# Patient Record
Sex: Female | Born: 1955 | Race: Black or African American | Hispanic: No | Marital: Single | Smoking: Current every day smoker
Health system: Southern US, Community
[De-identification: ages and names within clinical notes are randomized; demographics above are authoritative.]

## PROBLEM LIST (undated history)

## (undated) DIAGNOSIS — I428 Other cardiomyopathies: Secondary | ICD-10-CM

## (undated) DIAGNOSIS — Z720 Tobacco use: Secondary | ICD-10-CM

## (undated) DIAGNOSIS — I493 Ventricular premature depolarization: Secondary | ICD-10-CM

## (undated) DIAGNOSIS — J449 Chronic obstructive pulmonary disease, unspecified: Secondary | ICD-10-CM

## (undated) DIAGNOSIS — M545 Low back pain, unspecified: Secondary | ICD-10-CM

## (undated) DIAGNOSIS — M1990 Unspecified osteoarthritis, unspecified site: Secondary | ICD-10-CM

## (undated) DIAGNOSIS — I5022 Chronic systolic (congestive) heart failure: Secondary | ICD-10-CM

## (undated) DIAGNOSIS — N189 Chronic kidney disease, unspecified: Secondary | ICD-10-CM

## (undated) DIAGNOSIS — N96 Recurrent pregnancy loss: Secondary | ICD-10-CM

## (undated) DIAGNOSIS — G8929 Other chronic pain: Secondary | ICD-10-CM

## (undated) DIAGNOSIS — I10 Essential (primary) hypertension: Secondary | ICD-10-CM

## (undated) DIAGNOSIS — Z95810 Presence of automatic (implantable) cardiac defibrillator: Secondary | ICD-10-CM

## (undated) HISTORY — DX: Chronic obstructive pulmonary disease, unspecified: J44.9

## (undated) HISTORY — DX: Ventricular premature depolarization: I49.3

## (undated) HISTORY — DX: Essential (primary) hypertension: I10

## (undated) HISTORY — DX: Tobacco use: Z72.0

## (undated) HISTORY — DX: Chronic systolic (congestive) heart failure: I50.22

## (undated) HISTORY — PX: LACERATION REPAIR: SHX5168

## (undated) HISTORY — DX: Morbid (severe) obesity due to excess calories: E66.01

## (undated) HISTORY — PX: TOOTH EXTRACTION: SUR596

## (undated) HISTORY — DX: Recurrent pregnancy loss: N96

## (undated) HISTORY — DX: Other cardiomyopathies: I42.8

## (undated) HISTORY — PX: GANGLION CYST EXCISION: SHX1691

---

## 2004-10-05 ENCOUNTER — Emergency Department: Payer: Self-pay | Admitting: Emergency Medicine

## 2005-12-26 ENCOUNTER — Ambulatory Visit: Payer: Self-pay | Admitting: *Deleted

## 2011-11-17 ENCOUNTER — Ambulatory Visit: Payer: Self-pay

## 2011-11-17 ENCOUNTER — Inpatient Hospital Stay: Payer: Self-pay | Admitting: Internal Medicine

## 2011-11-17 DIAGNOSIS — R0602 Shortness of breath: Secondary | ICD-10-CM

## 2011-11-17 DIAGNOSIS — J9600 Acute respiratory failure, unspecified whether with hypoxia or hypercapnia: Secondary | ICD-10-CM

## 2011-11-17 LAB — CK TOTAL AND CKMB (NOT AT ARMC)
CK, Total: 131 U/L (ref 21–215)
CK, Total: 143 U/L (ref 21–215)
CK-MB: 1.6 ng/mL (ref 0.5–3.6)

## 2011-11-17 LAB — CBC
MCH: 29.4 pg (ref 26.0–34.0)
MCV: 89 fL (ref 80–100)
Platelet: 265 10*3/uL (ref 150–440)
RDW: 15.2 % — ABNORMAL HIGH (ref 11.5–14.5)
WBC: 5 10*3/uL (ref 3.6–11.0)

## 2011-11-17 LAB — COMPREHENSIVE METABOLIC PANEL
Albumin: 3.6 g/dL (ref 3.4–5.0)
Anion Gap: 8 (ref 7–16)
Calcium, Total: 9.2 mg/dL (ref 8.5–10.1)
Osmolality: 283 (ref 275–301)
Potassium: 4.1 mmol/L (ref 3.5–5.1)
SGOT(AST): 45 U/L — ABNORMAL HIGH (ref 15–37)
SGPT (ALT): 55 U/L (ref 12–78)
Total Protein: 7.2 g/dL (ref 6.4–8.2)

## 2011-11-17 LAB — PRO B NATRIURETIC PEPTIDE: B-Type Natriuretic Peptide: 2989 pg/mL — ABNORMAL HIGH (ref 0–125)

## 2011-11-18 LAB — LIPID PANEL
Cholesterol: 172 mg/dL (ref 0–200)
HDL Cholesterol: 74 mg/dL — ABNORMAL HIGH (ref 40–60)

## 2011-11-18 LAB — TROPONIN I: Troponin-I: 0.12 ng/mL — ABNORMAL HIGH

## 2011-11-19 ENCOUNTER — Encounter: Payer: Self-pay | Admitting: Cardiovascular Disease

## 2011-11-19 DIAGNOSIS — I428 Other cardiomyopathies: Secondary | ICD-10-CM

## 2011-11-19 HISTORY — PX: CARDIAC CATHETERIZATION: SHX172

## 2011-11-20 ENCOUNTER — Telehealth: Payer: Self-pay

## 2011-11-20 NOTE — Telephone Encounter (Signed)
TCM call

## 2011-11-20 NOTE — Telephone Encounter (Signed)
Message copied by Marcelle Overlie on Wed Nov 20, 2011  9:29 AM ------      Message from: [place] E      Created: Wed Nov 20, 2011  7:47 AM      Regarding: tcm patient       tcm patient

## 2011-11-21 NOTE — Telephone Encounter (Signed)
Pt called back She is feeling well post discharge from [facility] for dx CHF She denies sob, edema or any other symptoms of CHF She confirms compliance with all meds as she was given at d/c  She has all RX She has bought a scale for her home and has been weighing daily Her weight is only up 4 ounces compared to yesterday's weight She confirms compliance with low sodium diet Confirmed appt with Dr. Mariah Milling 11/5 at 0900

## 2011-11-21 NOTE — Telephone Encounter (Signed)
TCM attempt #1 LMTCB

## 2011-12-02 DIAGNOSIS — J449 Chronic obstructive pulmonary disease, unspecified: Secondary | ICD-10-CM | POA: Insufficient documentation

## 2011-12-03 ENCOUNTER — Ambulatory Visit (INDEPENDENT_AMBULATORY_CARE_PROVIDER_SITE_OTHER): Payer: BC Managed Care – PPO | Admitting: Cardiovascular Disease

## 2011-12-03 ENCOUNTER — Encounter: Payer: Self-pay | Admitting: Cardiovascular Disease

## 2011-12-03 VITALS — BP 112/80 | HR 71 | Ht 64.5 in | Wt 236.2 lb

## 2011-12-03 DIAGNOSIS — I428 Other cardiomyopathies: Secondary | ICD-10-CM | POA: Insufficient documentation

## 2011-12-03 DIAGNOSIS — R0602 Shortness of breath: Secondary | ICD-10-CM

## 2011-12-03 MED ORDER — CARVEDILOL 6.25 MG PO TABS: 6.2500 mg | ORAL_TABLET | Freq: Two times a day (BID) | ORAL | Status: DC

## 2011-12-03 NOTE — Progress Notes (Signed)
Patient ID: Charlene Schmitt, female    DOB: 13-Jul-1955, 56 y.o.   MRN: [id number]  HPI Comments: Charlene Schmitt is a pleasant 56 year old woman with history of obesity, hypertension who presented to the hospital with shortness of breath for the past 10 days. Workup documented nonischemic cardiomyopathy, ejection fraction less than 25%, cardiac catheterization showing no significant coronary artery disease, normal right ventricular systolic pressures on echocardiogram. She presents to establish care in the office.  She reports that since her discharge, her weight has been relatively stable at 235 pounds. She did have occasional episodes of chest fullness and she took extra diuretic with improvement of her symptoms. She denies any significant lower extremity edema. Blood pressure has been relatively stable. She is sleeping well, able to lie flat. She has recently returned to work but is not doing any heavy lifting.  EKG shows normal sinus rhythm with rate 71 beats per minute with nonspecific T wave abnormality in V4 through V6, 1 and aVL   Outpatient Encounter Prescriptions as of 12/03/2011  Medication Sig Dispense Refill  . albuterol (PROVENTIL HFA;VENTOLIN HFA) 108 (90 BASE) MCG/ACT inhaler Inhale 2 puffs into the lungs every 6 (six) hours as needed.      Marland Kitchen aspirin 81 MG tablet Take 81 mg by mouth daily.      . carvedilol (COREG) 6.25 MG tablet Take 1 tablet (6.25 mg total) by mouth 2 (two) times daily with a meal.  180 tablet  3  . fexofenadine (ALLEGRA) 180 MG tablet Take 180 mg by mouth daily as needed.      . Fluticasone-Salmeterol (ADVAIR DISKUS) 250-50 MCG/DOSE AEPB Inhale 1 puff into the lungs every 12 (twelve) hours.      . furosemide (LASIX) 20 MG tablet Take 20 mg by mouth 2 (two) times daily.      Marland Kitchen ibuprofen (ADVIL,MOTRIN) 200 MG tablet Take 200 mg by mouth every 6 (six) hours as needed.      Marland Kitchen lisinopril (PRINIVIL,ZESTRIL) 20 MG tablet Take 20 mg by mouth daily.      . potassium  chloride SA (K-DUR,KLOR-CON) 20 MEQ tablet Take 20 mEq by mouth 2 (two) times daily.       Review of Systems  Constitutional: Negative.   HENT: Negative.   Eyes: Negative.   Respiratory: Negative.   Cardiovascular: Negative.   Gastrointestinal: Negative.   Musculoskeletal: Negative.   Skin: Negative.   Neurological: Negative.   Hematological: Negative.   Psychiatric/Behavioral: Negative.   All other systems reviewed and are negative.    BP 112/80  Pulse 71  Ht 5' 4.5" (1.638 m)  Wt 236 lb 4 oz (107.162 kg)  BMI 39.93 kg/m2  Physical Exam  Nursing note and vitals reviewed. Constitutional: She is oriented to person, place, and time. She appears well-developed and well-nourished.  HENT:  Head: Normocephalic.  Nose: Nose normal.  Mouth/Throat: Oropharynx is clear and moist.  Eyes: Conjunctivae normal are normal. Pupils are equal, round, and reactive to light.  Neck: Normal range of motion. Neck supple. No JVD present.  Cardiovascular: Normal rate, regular rhythm, S1 normal, S2 normal, normal heart sounds and intact distal pulses.  Exam reveals no gallop and no friction rub.   No murmur heard. Pulmonary/Chest: Effort normal and breath sounds normal. No respiratory distress. She has no wheezes. She has no rales. She exhibits no tenderness.  Abdominal: Soft. Bowel sounds are normal. She exhibits no distension. There is no tenderness.  Musculoskeletal: Normal range of motion. She exhibits no  edema and no tenderness.  Lymphadenopathy:    She has no cervical adenopathy.  Neurological: She is alert and oriented to person, place, and time. Coordination normal.  Skin: Skin is warm and dry. No rash noted. No erythema.  Psychiatric: She has a normal mood and affect. Her behavior is normal. Judgment and thought content normal.         Assessment and Plan

## 2011-12-03 NOTE — Patient Instructions (Addendum)
You are doing well. Please increase the coreg to 6.25 mg twice a day  Continue to monitor your weight  Please call us if you have new issues that need to be addressed before your next appt.  Your physician wants you to follow-up in: 3 months.  You will receive a reminder letter in the mail two months in advance. If you don't receive a letter, please call our office to schedule the follow-up appointment.

## 2011-12-03 NOTE — Assessment & Plan Note (Signed)
Improvement of her shortness of breath. We have encouraged her to start a exercise program.

## 2011-12-03 NOTE — Assessment & Plan Note (Signed)
Cardiac catheterization showing no coronary artery disease. We'll continue aggressive medical management. We will increase the coronary to 6.25 mg twice a day, continue lisinopril, continue diuretic with close monitoring of her weight.

## 2011-12-09 ENCOUNTER — Encounter: Payer: Self-pay | Admitting: Cardiovascular Disease

## 2011-12-12 ENCOUNTER — Encounter: Payer: Self-pay | Admitting: Cardiovascular Disease

## 2012-02-05 ENCOUNTER — Telehealth: Payer: Self-pay | Admitting: Cardiovascular Disease

## 2012-02-05 NOTE — Telephone Encounter (Signed)
fyi

## 2012-02-05 NOTE — Telephone Encounter (Signed)
Could be ACE cough See how it goes

## 2012-02-05 NOTE — Telephone Encounter (Signed)
Pt says that she has had a cough for about 2 weeks. Pt started new meds in oct. Pt carvedilol was increased in December.

## 2012-02-05 NOTE — Telephone Encounter (Signed)
LMTCB

## 2012-02-05 NOTE — Telephone Encounter (Signed)
Pt says she has had a chronic nagging cough for several weeks now Denies worsening sob, edema, weight change Afebrile Is awakened at night with cough Productive of white secretions Confirms compliance with meds i advised her to hold lisinopril for a few days to see if this helps cough I will call her back Friday to reassess Understanding verb.

## 2012-02-07 ENCOUNTER — Other Ambulatory Visit: Payer: Self-pay

## 2012-02-07 ENCOUNTER — Telehealth: Payer: Self-pay

## 2012-02-07 MED ORDER — LOSARTAN POTASSIUM 50 MG PO TABS: 50.0000 mg | ORAL_TABLET | Freq: Every day | ORAL | Status: DC

## 2012-02-07 NOTE — Telephone Encounter (Signed)
Call pt to assess cough since holding lisinopril

## 2012-02-07 NOTE — Telephone Encounter (Signed)
"  Have pt monitor blood pressures. Stop lisinopril. Start losartan 50 mg daily. If blood pressure does not tolerate, may decrease to 25 mg daily" VO Dr. Alvis Lemmings, RN  Pt informed Understanding verb New RX sent to pharmacy

## 2012-02-07 NOTE — Telephone Encounter (Signed)
Pt called back Says her cough has improved "quite a bit" since holding lisinopril I told her I will let Dr. Mariah Milling know and call her back at [phone].

## 2012-02-07 NOTE — Telephone Encounter (Signed)
Attempted to call pt at home #; message says "patient cannot accept calls at this time" I then tried to reach pt at work #; I was told she is not in today I then tried home # again and got same message as before Will try again later

## 2012-02-07 NOTE — Telephone Encounter (Signed)
No answer

## 2012-02-11 ENCOUNTER — Telehealth: Payer: Self-pay

## 2012-02-11 NOTE — Telephone Encounter (Signed)
Sometimes fluid can present with a cough She has low ejection fraction and prone to heart failure That she should try several days of extra diuretic Hold the losartan until cough resolves and then consider trying the losartan 1 more time Need to make sure that the cough from lisinopril or losartan is not from heart failure

## 2012-02-11 NOTE — Telephone Encounter (Signed)
Pt started losartan as directed She says her cough has returned despite stopping lisinopril Cough is productive; coughing up white sputum Afebrile BP=127/80 I told her to continue meds as prescribed until I talk with Dr. Mariah Milling and then I will call her back Understanding verb

## 2012-02-12 NOTE — Telephone Encounter (Signed)
Pt called back. Instructions given re: extra diuretic I instructed her to resume carvedilol since she has low EF Understanding verb She will see how cough improves with extra lasix We will check back with her Monday 1/20

## 2012-02-12 NOTE — Telephone Encounter (Signed)
Attempted to reach pt at work but was told she is out of work rest of week She gave me pt's cell # (listed as home #) to call I called cell # provided/home # and had to [facility]

## 2012-02-17 ENCOUNTER — Telehealth: Payer: Self-pay

## 2012-02-17 NOTE — Telephone Encounter (Signed)
What is her weight running? She was previously 235. We may need basic metabolic panel and BNP. Follow up in clinic

## 2012-02-17 NOTE — Telephone Encounter (Signed)
Assess cough

## 2012-02-17 NOTE — Telephone Encounter (Signed)
Pt says she is taking an extra lasix daily since we instructed her to do so. Says cough has improved "a little" but she is still coughing up "white stuff". I told her I would let Dr. Mariah Milling know and call her back. Understanding verb.

## 2012-02-18 ENCOUNTER — Other Ambulatory Visit: Payer: Self-pay

## 2012-02-18 ENCOUNTER — Ambulatory Visit (INDEPENDENT_AMBULATORY_CARE_PROVIDER_SITE_OTHER): Payer: BC Managed Care – PPO

## 2012-02-18 DIAGNOSIS — I509 Heart failure, unspecified: Secondary | ICD-10-CM

## 2012-02-18 NOTE — Telephone Encounter (Signed)
Pt says weight is up about 2 pounds (237 pounds). Still has cough I advised, per Dr. Mariah Milling, to come in today for labs and f/u with him tomm in clinic Understanding verb

## 2012-02-19 ENCOUNTER — Encounter: Payer: Self-pay | Admitting: Cardiovascular Disease

## 2012-02-19 ENCOUNTER — Ambulatory Visit (INDEPENDENT_AMBULATORY_CARE_PROVIDER_SITE_OTHER): Payer: BC Managed Care – PPO | Admitting: Cardiovascular Disease

## 2012-02-19 VITALS — BP 148/98 | HR 66 | Ht 65.5 in | Wt 237.5 lb

## 2012-02-19 DIAGNOSIS — F17200 Nicotine dependence, unspecified, uncomplicated: Secondary | ICD-10-CM

## 2012-02-19 DIAGNOSIS — R0602 Shortness of breath: Secondary | ICD-10-CM

## 2012-02-19 DIAGNOSIS — I509 Heart failure, unspecified: Secondary | ICD-10-CM

## 2012-02-19 DIAGNOSIS — I428 Other cardiomyopathies: Secondary | ICD-10-CM

## 2012-02-19 LAB — BASIC METABOLIC PANEL
Calcium: 9.3 mg/dL (ref 8.7–10.2)
Chloride: 107 mmol/L (ref 97–108)
GFR calc non Af Amer: 73 mL/min/{1.73_m2} (ref >59)
Glucose: 104 mg/dL — ABNORMAL HIGH (ref 65–99)
Potassium: 4 mmol/L (ref 3.5–5.2)
Sodium: 144 mmol/L (ref 134–144)

## 2012-02-19 MED ORDER — TORSEMIDE 20 MG PO TABS: 40.0000 mg | ORAL_TABLET | Freq: Two times a day (BID) | ORAL | Status: DC

## 2012-02-19 MED ORDER — ISOSORBIDE MONONITRATE ER 30 MG PO TB24: 30.0000 mg | ORAL_TABLET | Freq: Every day | ORAL | Status: DC

## 2012-02-19 NOTE — Assessment & Plan Note (Signed)
Advance diuretic regimen, isosorbide for blood pressure, recommend she stop smoking

## 2012-02-19 NOTE — Progress Notes (Signed)
Patient ID: Charlene Schmitt, female    DOB: [birth year], 57 y.o.   MRN: [id number]  HPI Comments: Charlene Schmitt is a pleasant 57 year old woman with history of obesity, hypertension who presented to the hospital with shortness of breath,  Workup documented nonischemic cardiomyopathy, ejection fraction less than 25%, cardiac catheterization showing no significant coronary artery disease, normal right ventricular systolic pressures on echocardiogram. She presents for routine follow up.    her weight has been relatively stable, she is up 2 pounds from previous weight of  235 pounds. She does report having a significant cough. She has been slowly increasing her diuretic now taking 20 mg 3 times a day with no improvement of her cough. Basic metabolic panel done yesterday shows normal renal function, normal potassium. We stopped the ACE inhibitor with no improvement of her cough. Currently she takes losartan. She does report blood pressure has been running high recently. She denies any significant edema or abdominal fullness. Some fullness in her chest.  EKG shows normal sinus rhythm with rate 66  beats per minute with nonspecific T wave abnormality in V4 through V6, 1 and aVL   Outpatient Encounter Prescriptions as of 02/19/2012  Medication Sig Dispense Refill  . albuterol (PROVENTIL HFA;VENTOLIN HFA) 108 (90 BASE) MCG/ACT inhaler Inhale 2 puffs into the lungs every 6 (six) hours as needed.      Marland Kitchen aspirin 81 MG tablet Take 81 mg by mouth daily.      . carvedilol (COREG) 6.25 MG tablet Take 1 tablet (6.25 mg total) by mouth 2 (two) times daily with a meal.  180 tablet  3  . fexofenadine (ALLEGRA) 180 MG tablet Take 180 mg by mouth daily as needed.      . Fluticasone-Salmeterol (ADVAIR DISKUS) 250-50 MCG/DOSE AEPB Inhale 1 puff into the lungs every 12 (twelve) hours.      Marland Kitchen ibuprofen (ADVIL,MOTRIN) 200 MG tablet Take 200 mg by mouth every 6 (six) hours as needed.      Marland Kitchen losartan (COZAAR) 50 MG tablet  Take 1 tablet (50 mg total) by mouth daily.  90 tablet  3  . [DISCONTINUED] furosemide (LASIX) 20 MG tablet Take 20 mg by mouth 3 (three) times daily.       . isosorbide mononitrate (IMDUR) 30 MG 24 hr tablet Take 1 tablet (30 mg total) by mouth daily.  30 tablet  6  . torsemide (DEMADEX) 20 MG tablet Take 2 tablets (40 mg total) by mouth 2 (two) times daily.  120 tablet  6  . [DISCONTINUED] potassium chloride SA (K-DUR,KLOR-CON) 20 MEQ tablet Take 20 mEq by mouth 2 (two) times daily.        Review of Systems  Constitutional: Negative.   HENT: Negative.   Eyes: Negative.   Respiratory: Positive for cough and shortness of breath.        Chest fullness  Cardiovascular: Negative.   Gastrointestinal: Negative.   Musculoskeletal: Negative.   Skin: Negative.   Neurological: Negative.   Hematological: Negative.   Psychiatric/Behavioral: Negative.   All other systems reviewed and are negative.    BP 148/98  Pulse 66  Ht 5' 5.5" (1.664 m)  Wt 237 lb 8 oz (107.729 kg)  BMI 38.92 kg/m2  Physical Exam  Nursing note and vitals reviewed. Constitutional: She is oriented to person, place, and time. She appears well-developed and well-nourished.       obese  HENT:  Head: Normocephalic.  Nose: Nose normal.  Mouth/Throat: Oropharynx is clear and moist.  Eyes: Conjunctivae normal are normal. Pupils are equal, round, and reactive to light.  Neck: Normal range of motion. Neck supple. No JVD present.  Cardiovascular: Normal rate, regular rhythm, S1 normal, S2 normal, normal heart sounds and intact distal pulses.  Exam reveals no gallop and no friction rub.   No murmur heard. Pulmonary/Chest: Effort normal and breath sounds normal. No respiratory distress. She has no wheezes. She has no rales. She exhibits no tenderness.  Abdominal: Soft. Bowel sounds are normal. She exhibits no distension. There is no tenderness.  Musculoskeletal: Normal range of motion. She exhibits no edema and no tenderness.    Lymphadenopathy:    She has no cervical adenopathy.  Neurological: She is alert and oriented to person, place, and time. Coordination normal.  Skin: Skin is warm and dry. No rash noted. No erythema.  Psychiatric: She has a normal mood and affect. Her behavior is normal. Judgment and thought content normal.         Assessment and Plan

## 2012-02-19 NOTE — Assessment & Plan Note (Signed)
Cough over the past few weeks, no improvement holding ACE inhibitor or increasing Lasix. We will change her to torsemide 40 mg twice a day, add isosorbide 30 mg daily. She continues to smoke

## 2012-02-19 NOTE — Patient Instructions (Addendum)
Hold lasix   Start torsemide 40 mg twice a day  Start isosorbide for high blood pressure and for heart  Please call us if you have new issues that need to be addressed before your next appt.  Follow up in one month

## 2012-02-19 NOTE — Assessment & Plan Note (Signed)
We have encouraged her to continue to work on weaning her cigarettes and smoking cessation. She will continue to work on this and does not want any assistance with chantix.  

## 2012-03-04 ENCOUNTER — Ambulatory Visit (INDEPENDENT_AMBULATORY_CARE_PROVIDER_SITE_OTHER): Payer: BC Managed Care – PPO | Admitting: Cardiovascular Disease

## 2012-03-04 ENCOUNTER — Encounter: Payer: Self-pay | Admitting: Cardiovascular Disease

## 2012-03-04 VITALS — BP 100/80 | HR 78 | Ht 65.0 in | Wt 236.8 lb

## 2012-03-04 DIAGNOSIS — R0789 Other chest pain: Secondary | ICD-10-CM

## 2012-03-04 DIAGNOSIS — R0602 Shortness of breath: Secondary | ICD-10-CM

## 2012-03-04 DIAGNOSIS — I428 Other cardiomyopathies: Secondary | ICD-10-CM

## 2012-03-04 DIAGNOSIS — F17200 Nicotine dependence, unspecified, uncomplicated: Secondary | ICD-10-CM

## 2012-03-04 MED ORDER — ISOSORBIDE MONONITRATE ER 30 MG PO TB24: 30.0000 mg | ORAL_TABLET | Freq: Two times a day (BID) | ORAL | Status: DC

## 2012-03-04 NOTE — Progress Notes (Signed)
Patient ID: Charlene Schmitt, female    DOB: [birth year], 57 y.o.   MRN: [id number]  HPI Comments: Charlene Schmitt is a pleasant 57 year old woman with history of obesity, hypertension who presented to the hospital with shortness of breath,  found to have nonischemic cardiomyopathy, ejection fraction less than 25%, cardiac catheterization showing no significant coronary artery disease, normal right ventricular systolic pressures on echocardiogram. She presents for routine follow up.   Underlies clinic visit, she had weight gain and cough. ACE inhibitor was held in the past, changed to losartan with no significant improvement in her symptoms. She was started on torsemide, Lasix was held. She is taking torsemide 20 mg 3 times a day. Also started on isosorbide mononitrate 30 mg daily. She feels well on this combination. Shortness of breath and cough have improved. Better energy. Overall feels much better. She denies lower extremity edema, lies flat in bed to sleep.  Blood pressure typically in the 120-130 range systolic She does not use her inhalers as much now as her breathing is better  EKG shows normal sinus rhythm with rate 78  beats per minute with nonspecific T wave abnormality in V4 through V6, 1 and aVL   Outpatient Encounter Prescriptions as of 03/04/2012  Medication Sig Dispense Refill  . albuterol (PROVENTIL HFA;VENTOLIN HFA) 108 (90 BASE) MCG/ACT inhaler Inhale 2 puffs into the lungs every 6 (six) hours as needed.      Marland Kitchen aspirin 81 MG tablet Take 81 mg by mouth daily.      . carvedilol (COREG) 6.25 MG tablet Take 1 tablet (6.25 mg total) by mouth 2 (two) times daily with a meal.  180 tablet  3  . fexofenadine (ALLEGRA) 180 MG tablet Take 180 mg by mouth daily as needed.      . Fluticasone-Salmeterol (ADVAIR DISKUS) 250-50 MCG/DOSE AEPB Inhale 1 puff into the lungs as needed.       Marland Kitchen ibuprofen (ADVIL,MOTRIN) 200 MG tablet Take 200 mg by mouth every 6 (six) hours as needed.      Marland Kitchen losartan  (COZAAR) 50 MG tablet Take 1 tablet (50 mg total) by mouth daily.  90 tablet  3  . omeprazole (PRILOSEC) 20 MG capsule Take 20 mg by mouth daily.      . potassium chloride SA (K-DUR,KLOR-CON) 20 MEQ tablet Take 20 mEq by mouth 2 (two) times daily.      Marland Kitchen torsemide (DEMADEX) 20 MG tablet Take 2 tablets (40 mg total) by mouth 2 (two) times daily.she takes this 20 mg 3 times a day   120 tablet  6  .  isosorbide mononitrate (IMDUR) 30 MG 24 hr tablet Take 1 tablet (30 mg total) by mouth daily.  30 tablet  6    Review of Systems  Constitutional: Negative.   HENT: Negative.   Eyes: Negative.   Cardiovascular: Negative.   Gastrointestinal: Negative.   Musculoskeletal: Negative.   Skin: Negative.   Neurological: Negative.   Hematological: Negative.   Psychiatric/Behavioral: Negative.   All other systems reviewed and are negative.    BP 100/80  Pulse 78  Ht 5\' 5"  (1.651 m)  Wt 236 lb 12 oz (107.389 kg)  BMI 39.40 kg/m2  Physical Exam  Nursing note and vitals reviewed. Constitutional: She is oriented to person, place, and time. She appears well-developed and well-nourished.       obese  HENT:  Head: Normocephalic.  Nose: Nose normal.  Mouth/Throat: Oropharynx is clear and moist.  Eyes: Conjunctivae normal are  normal. Pupils are equal, round, and reactive to light.  Neck: Normal range of motion. Neck supple. No JVD present.  Cardiovascular: Normal rate, regular rhythm, S1 normal, S2 normal, normal heart sounds and intact distal pulses.  Exam reveals no gallop and no friction rub.   No murmur heard. Pulmonary/Chest: Effort normal and breath sounds normal. No respiratory distress. She has no wheezes. She has no rales. She exhibits no tenderness.  Abdominal: Soft. Bowel sounds are normal. She exhibits no distension. There is no tenderness.  Musculoskeletal: Normal range of motion. She exhibits no edema and no tenderness.  Lymphadenopathy:    She has no cervical adenopathy.   Neurological: She is alert and oriented to person, place, and time. Coordination normal.  Skin: Skin is warm and dry. No rash noted. No erythema.  Psychiatric: She has a normal mood and affect. Her behavior is normal. Judgment and thought content normal.         Assessment and Plan

## 2012-03-04 NOTE — Patient Instructions (Addendum)
You are doing well. Consider increasing the isosorbide twice a day  We will schedule an echocardiogram in June  Please call us if you have new issues that need to be addressed before your next appt.  Your physician wants you to follow-up in: June after echo You will receive a reminder letter in the mail two months in advance. If you don't receive a letter, please call our office to schedule the follow-up appointment.

## 2012-03-04 NOTE — Assessment & Plan Note (Signed)
We have encouraged her to continue to work on weaning her cigarettes and smoking cessation. She will continue to work on this and does not want any assistance with chantix.  

## 2012-03-04 NOTE — Assessment & Plan Note (Addendum)
Feeling better. We'll continue torsemide 3 times a day. We have previously suggested 40 mg twice a day, she preferred 20 mg 3 times a day. We have also suggested she increase isosorbide to 30 mg twice a day. Repeat echo in several months time at her convenience. We'll need to reevaluate her ejection fraction.

## 2012-03-04 NOTE — Assessment & Plan Note (Signed)
Shortness of breath has improved. Feels more like herself.  better on torsemide, also by adding long-acting nitrates

## 2012-07-07 ENCOUNTER — Other Ambulatory Visit: Payer: BC Managed Care – PPO

## 2012-07-09 ENCOUNTER — Ambulatory Visit: Payer: BC Managed Care – PPO | Admitting: Cardiovascular Disease

## 2012-08-11 ENCOUNTER — Other Ambulatory Visit: Payer: Self-pay

## 2012-08-11 ENCOUNTER — Other Ambulatory Visit (INDEPENDENT_AMBULATORY_CARE_PROVIDER_SITE_OTHER): Payer: BC Managed Care – PPO

## 2012-08-11 DIAGNOSIS — R0602 Shortness of breath: Secondary | ICD-10-CM

## 2012-08-11 DIAGNOSIS — R0789 Other chest pain: Secondary | ICD-10-CM

## 2012-08-11 DIAGNOSIS — I429 Cardiomyopathy, unspecified: Secondary | ICD-10-CM

## 2012-08-14 ENCOUNTER — Encounter: Payer: Self-pay | Admitting: Cardiovascular Disease

## 2012-08-14 ENCOUNTER — Ambulatory Visit (INDEPENDENT_AMBULATORY_CARE_PROVIDER_SITE_OTHER): Payer: BC Managed Care – PPO | Admitting: Cardiovascular Disease

## 2012-08-14 VITALS — BP 110/82 | HR 68 | Ht 65.5 in | Wt 237.5 lb

## 2012-08-14 DIAGNOSIS — I428 Other cardiomyopathies: Secondary | ICD-10-CM

## 2012-08-14 DIAGNOSIS — R0602 Shortness of breath: Secondary | ICD-10-CM

## 2012-08-14 DIAGNOSIS — F17200 Nicotine dependence, unspecified, uncomplicated: Secondary | ICD-10-CM

## 2012-08-14 NOTE — Progress Notes (Signed)
Patient ID: Charlene Schmitt, female    DOB: [birth year], 57 y.o.   MRN: [id number]  HPI Comments: Charlene Schmitt is a pleasant 57 year old woman with history of obesity, hypertension who presented to the hospital with shortness of breath,  found to have nonischemic cardiomyopathy, ejection fraction less than 25%, cardiac catheterization showing no significant coronary artery disease, normal right ventricular systolic pressures on echocardiogram. She presents for routine follow up.   Previously tried on ACE inhibitors with a cough, changed to losartan now with no symptoms. She had acute on chronic systolic CHF on Lasix and was changed to torsemide with better results. On torsemide 40 mg twice a day, she has been stable. She does drink a significant amount of weight  Better energy. Overall feels much better. She denies lower extremity edema, lies flat in bed to sleep.  Blood pressure typically in the 120-130 range systolic Weight is down 4 pounds from her prior clinic visit   Previous labs in October 2013 showed total cholesterol 172, LDL 80, HDL 74  EKG shows normal sinus rhythm with rate 78  beats per minute with nonspecific T wave abnormality in V2 through V6, 1 and aVL   Outpatient Encounter Prescriptions as of 08/14/2012  Medication Sig Dispense Refill  . albuterol (PROVENTIL HFA;VENTOLIN HFA) 108 (90 BASE) MCG/ACT inhaler Inhale 2 puffs into the lungs every 6 (six) hours as needed.      Marland Kitchen aspirin 81 MG tablet Take 81 mg by mouth daily.      . carvedilol (COREG) 6.25 MG tablet Take 1 tablet (6.25 mg total) by mouth 2 (two) times daily with a meal.  180 tablet  3  . Fluticasone-Salmeterol (ADVAIR DISKUS) 250-50 MCG/DOSE AEPB Inhale 1 puff into the lungs as needed.       Marland Kitchen ibuprofen (ADVIL,MOTRIN) 200 MG tablet Take 200 mg by mouth every 6 (six) hours as needed.      . isosorbide mononitrate (IMDUR) 30 MG 24 hr tablet Take 1 tablet (30 mg total) by mouth 2 (two) times daily.  60 tablet  11   . levocetirizine (XYZAL) 5 MG tablet Take 5 mg by mouth daily.       Marland Kitchen losartan (COZAAR) 50 MG tablet Take 1 tablet (50 mg total) by mouth daily.  90 tablet  3  . omeprazole (PRILOSEC) 20 MG capsule Take 20 mg by mouth daily.      . potassium chloride SA (K-DUR,KLOR-CON) 20 MEQ tablet Take 20 mEq by mouth 2 (two) times daily.      Marland Kitchen torsemide (DEMADEX) 20 MG tablet Take 2 tablets (40 mg total) by mouth 2 (two) times daily.  120 tablet  6    Review of Systems  Constitutional: Negative.   HENT: Negative.   Eyes: Negative.   Cardiovascular: Negative.   Gastrointestinal: Negative.   Musculoskeletal: Negative.   Skin: Negative.   Neurological: Negative.   Psychiatric/Behavioral: Negative.   All other systems reviewed and are negative.    BP 110/82  Pulse 68  Ht 5' 5.5" (1.664 m)  Wt 237 lb 8 oz (107.729 kg)  BMI 38.91 kg/m2  Physical Exam  Nursing note and vitals reviewed. Constitutional: She is oriented to person, place, and time. She appears well-developed and well-nourished.  obese  HENT:  Head: Normocephalic.  Nose: Nose normal.  Mouth/Throat: Oropharynx is clear and moist.  Eyes: Conjunctivae are normal. Pupils are equal, round, and reactive to light.  Neck: Normal range of motion. Neck supple. No JVD present.  Cardiovascular: Normal rate, regular rhythm, S1 normal, S2 normal, normal heart sounds and intact distal pulses.  Exam reveals no gallop and no friction rub.   No murmur heard. Pulmonary/Chest: Effort normal and breath sounds normal. No respiratory distress. She has no wheezes. She has no rales. She exhibits no tenderness.  Abdominal: Soft. Bowel sounds are normal. She exhibits no distension. There is no tenderness.  Musculoskeletal: Normal range of motion. She exhibits no edema and no tenderness.  Lymphadenopathy:    She has no cervical adenopathy.  Neurological: She is alert and oriented to person, place, and time. Coordination normal.  Skin: Skin is warm and  dry. No rash noted. No erythema.  Psychiatric: She has a normal mood and affect. Her behavior is normal. Judgment and thought content normal.    Assessment and Plan

## 2012-08-14 NOTE — Assessment & Plan Note (Signed)
Recent echocardiogram showing ejection fraction 30-35% which is an improvement from 25% previously. We'll continue current medications.

## 2012-08-14 NOTE — Assessment & Plan Note (Addendum)
Shortness of breath improved with aggressive diuresis. Stable for now. Echocardiogram showing elevated right ventricular systolic pressures of 50. Encouraged she decrease her fluid intake mildly, possibly increase diuretic slightly. We'll check basic metabolic panel today.

## 2012-08-14 NOTE — Patient Instructions (Addendum)
You are doing well. No medication changes were made.  We will check blood work today  Please call us if you have new issues that need to be addressed before your next appt.  Your physician wants you to follow-up in: 6 months.  You will receive a reminder letter in the mail two months in advance. If you don't receive a letter, please call our office to schedule the follow-up appointment.   

## 2012-08-14 NOTE — Assessment & Plan Note (Signed)
We have encouraged her to continue to work on weaning her cigarettes and smoking cessation. She will continue to work on this and does not want any assistance with chantix.  

## 2012-08-15 LAB — BASIC METABOLIC PANEL
BUN/Creatinine Ratio: 13 (ref 9–23)
BUN: 12 mg/dL (ref 6–24)
CO2: 27 mmol/L (ref 18–29)
Calcium: 9.6 mg/dL (ref 8.7–10.2)
Creatinine, Ser: 0.9 mg/dL (ref 0.57–1.00)
GFR calc non Af Amer: 71 mL/min/{1.73_m2} (ref >59)
Sodium: 143 mmol/L (ref 134–144)

## 2012-09-01 ENCOUNTER — Telehealth: Payer: Self-pay

## 2012-09-01 NOTE — Telephone Encounter (Signed)
Returned call to pt.  Pt states she has had an increased cough x 2-3 days, non-productive.  Weight up x2 days 3lbs.  Minimal SOB, c/o a little chest tightness when breathes, denies chest pain or pressure.  Able to lay down at night without difficulty breathing.  Took extra dosage of fluid medication today to see if reduces cough and chest tightness when breathing. Pt will call back tomorrow after weighs to report on how she is feeling.  Pt advised if becomes acutely SOB or develops chest pain to go to ED.  Will await call back tomorrow.

## 2012-09-01 NOTE — Telephone Encounter (Signed)
Pt states over the last few days she has been doing a lot of coughing, denies sob, pain, states she does have some pressure. Please call.

## 2012-09-02 NOTE — Telephone Encounter (Signed)
Spoke with pt feeling some better, coughing slightly decreased but still persists.  Breathing fine, BP 136/88.  Took extra Lasix today urine output seems to have slowed.  Advised pt to be careful taking extra Lasix because we do not want her to become dehydrated.  Pt verbalizes understanding.  Pt states she will continue to monitor urine output and coughing and will call back tomorrow for appt if not feeling back to her baseline.  Will await call back tomorrow.

## 2012-09-03 ENCOUNTER — Telehealth: Payer: Self-pay | Admitting: *Deleted

## 2012-09-03 NOTE — Telephone Encounter (Signed)
Left message for call back.

## 2012-09-03 NOTE — Telephone Encounter (Signed)
Patient having a problem with fluid build up in chest. Please advise patient.

## 2012-09-03 NOTE — Telephone Encounter (Signed)
Pt is calling back, states she was told she may be able to be seen today. Please advise

## 2012-09-07 NOTE — Telephone Encounter (Signed)
Called spoke with pt still has minimal cough when lays down at night.  Taking fluid pills regularly as rx.  Pt WCB for appt if breathing worsens for appt.

## 2012-12-01 ENCOUNTER — Other Ambulatory Visit: Payer: Self-pay | Admitting: *Deleted

## 2012-12-01 ENCOUNTER — Other Ambulatory Visit: Payer: Self-pay | Admitting: Cardiovascular Disease

## 2012-12-01 MED ORDER — LOSARTAN POTASSIUM 50 MG PO TABS: 50.0000 mg | ORAL_TABLET | Freq: Every day | ORAL | Status: DC

## 2012-12-01 MED ORDER — TORSEMIDE 20 MG PO TABS: 40.0000 mg | ORAL_TABLET | Freq: Two times a day (BID) | ORAL | Status: DC

## 2012-12-01 MED ORDER — CARVEDILOL 6.25 MG PO TABS: 6.2500 mg | ORAL_TABLET | Freq: Two times a day (BID) | ORAL | Status: DC

## 2012-12-01 NOTE — Telephone Encounter (Signed)
Requested Prescriptions   Signed Prescriptions Disp Refills  . carvedilol (COREG) 6.25 MG tablet 180 tablet 3    Sig: Take 1 tablet (6.25 mg total) by mouth 2 (two) times daily with a meal.    Authorizing Provider: Antonieta Iba    Ordering User: Shawnie Dapper, MARINA C  . torsemide (DEMADEX) 20 MG tablet 120 tablet 6    Sig: Take 2 tablets (40 mg total) by mouth 2 (two) times daily.    Authorizing Provider: Antonieta Iba    Ordering User: Kendrick Fries  . losartan (COZAAR) 50 MG tablet 90 tablet 3    Sig: Take 1 tablet (50 mg total) by mouth daily.    Authorizing Provider: Antonieta Iba    Ordering User: Kendrick Fries

## 2012-12-15 ENCOUNTER — Ambulatory Visit (INDEPENDENT_AMBULATORY_CARE_PROVIDER_SITE_OTHER): Payer: BC Managed Care – PPO | Admitting: Cardiovascular Disease

## 2012-12-15 ENCOUNTER — Encounter: Payer: Self-pay | Admitting: Cardiovascular Disease

## 2012-12-15 VITALS — BP 108/80 | HR 77 | Ht 65.5 in | Wt 241.5 lb

## 2012-12-15 DIAGNOSIS — F17200 Nicotine dependence, unspecified, uncomplicated: Secondary | ICD-10-CM

## 2012-12-15 DIAGNOSIS — I428 Other cardiomyopathies: Secondary | ICD-10-CM

## 2012-12-15 DIAGNOSIS — R05 Cough: Secondary | ICD-10-CM

## 2012-12-15 DIAGNOSIS — R059 Cough, unspecified: Secondary | ICD-10-CM | POA: Insufficient documentation

## 2012-12-15 MED ORDER — METOLAZONE 5 MG PO TABS
5.0000 mg | ORAL_TABLET | Freq: Every day | ORAL | Status: DC | PRN
Start: 2012-12-15 — End: 2016-02-07

## 2012-12-15 MED ORDER — AZITHROMYCIN 250 MG PO TABS: ORAL_TABLET | ORAL | Status: DC

## 2012-12-15 MED ORDER — ALBUTEROL SULFATE HFA 108 (90 BASE) MCG/ACT IN AERS: 2.0000 | INHALATION_SPRAY | Freq: Four times a day (QID) | RESPIRATORY_TRACT | Status: DC | PRN

## 2012-12-15 MED ORDER — TORSEMIDE 20 MG PO TABS: 60.0000 mg | ORAL_TABLET | Freq: Two times a day (BID) | ORAL | Status: DC

## 2012-12-15 NOTE — Progress Notes (Signed)
Patient ID: Charlene Schmitt, female    DOB: 11-Nov-1955, 57 y.o.   MRN: [id number]  HPI Comments: Charlene Schmitt is a pleasant 57 year old woman with history of obesity, hypertension, history of nonischemic cardiomyopathy, ejection fraction less than 25%, cardiac catheterization showing no significant coronary artery disease, normal right ventricular systolic pressures on echocardiogram. She presents for routine follow up.   Previously tried on ACE inhibitors with a cough, changed to losartan  She had acute on chronic systolic CHF on Lasix and was changed to torsemide twice a day with better results. On torsemide 40 mg twice a day, she has been stable. She does drink a significant amount of water  In followup today, she reports that she has a cough for over one week. No significant sputum, it is white, no other malaise or viral-type symptoms. She denies having any asthma, she does take her inhalers. She continues to smoke. She is uncertain if it is from fluids. Her weight is up 5 pounds. General she has been consistently torsemide 40 mg twice a day  She denies lower extremity edema, lies flat in bed to sleep. Cough is worse when laying in bed Blood pressure typically in the 120-130 range systolic  Previous labs in October 2013 showed total cholesterol 172, LDL 80, HDL 74  EKG shows normal sinus rhythm with rate of 77 beats per minute with nonspecific T wave abnormality in V3 through V6, 1 and aVL   Outpatient Encounter Prescriptions as of 12/15/2012  Medication Sig  . albuterol (PROVENTIL HFA;VENTOLIN HFA) 108 (90 BASE) MCG/ACT inhaler Inhale 2 puffs into the lungs every 6 (six) hours as needed.  Marland Kitchen aspirin 81 MG tablet Take 81 mg by mouth daily.  . carvedilol (COREG) 6.25 MG tablet TAKE 1 TABLET (6.25 MG TOTAL) BY MOUTH 2 (TWO) TIMES DAILY WITH A MEAL.  Marland Kitchen Fluticasone-Salmeterol (ADVAIR DISKUS) 250-50 MCG/DOSE AEPB Inhale 1 puff into the lungs as needed.   Marland Kitchen ibuprofen (ADVIL,MOTRIN) 200 MG  tablet Take 200 mg by mouth every 6 (six) hours as needed.  . isosorbide mononitrate (IMDUR) 30 MG 24 hr tablet Take 1 tablet (30 mg total) by mouth 2 (two) times daily.  Marland Kitchen levocetirizine (XYZAL) 5 MG tablet Take 5 mg by mouth daily.   Marland Kitchen losartan (COZAAR) 50 MG tablet Take 1 tablet (50 mg total) by mouth daily.  Marland Kitchen omeprazole (PRILOSEC) 20 MG capsule Take 20 mg by mouth daily.  . potassium chloride SA (K-DUR,KLOR-CON) 20 MEQ tablet Take 20 mEq by mouth 2 (two) times daily.  Marland Kitchen torsemide (DEMADEX) 20 MG tablet TAKE 2 TABLETS BY MOUTH TWICE A DAY     Review of Systems  Constitutional: Negative.   HENT: Negative.   Eyes: Negative.   Respiratory: Positive for cough and shortness of breath.   Cardiovascular: Negative.   Gastrointestinal: Negative.   Endocrine: Negative.   Musculoskeletal: Negative.   Skin: Negative.   Allergic/Immunologic: Negative.   Neurological: Negative.   Hematological: Negative.   Psychiatric/Behavioral: Negative.   All other systems reviewed and are negative.    BP 108/80  Pulse 77  Ht 5' 5.5" (1.664 m)  Wt 241 lb 8 oz (109.544 kg)  BMI 39.56 kg/m2  Physical Exam  Nursing note and vitals reviewed. Constitutional: She is oriented to person, place, and time. She appears well-developed and well-nourished.  obese  HENT:  Head: Normocephalic.  Nose: Nose normal.  Mouth/Throat: Oropharynx is clear and moist.  Eyes: Conjunctivae are normal. Pupils are equal, round, and reactive  to light.  Neck: Normal range of motion. Neck supple. No JVD present.  Cardiovascular: Normal rate, regular rhythm, S1 normal, S2 normal, normal heart sounds and intact distal pulses.  Exam reveals no gallop and no friction rub.   No murmur heard. Pulmonary/Chest: Effort normal and breath sounds normal. No respiratory distress. She has no wheezes. She has no rales. She exhibits no tenderness.  Abdominal: Soft. Bowel sounds are normal. She exhibits no distension. There is no tenderness.   Musculoskeletal: Normal range of motion. She exhibits no edema and no tenderness.  Lymphadenopathy:    She has no cervical adenopathy.  Neurological: She is alert and oriented to person, place, and time. Coordination normal.  Skin: Skin is warm and dry. No rash noted. No erythema.  Psychiatric: She has a normal mood and affect. Her behavior is normal. Judgment and thought content normal.    Assessment and Plan

## 2012-12-15 NOTE — Assessment & Plan Note (Signed)
We have encouraged her to continue to work on weaning her cigarettes and smoking cessation. She will continue to work on this and does not want any assistance with chantix.  

## 2012-12-15 NOTE — Patient Instructions (Signed)
For cough, try extra torsemide (up to 60 mg twice a day) Watch your fluids You could also take metolazone 2.5 to 5 mg pill thirty minutes before the torsemide in the morning  If no improvement in the cough, consider starting the Zpak, two pills the first day then one a day for 4 more days  Please call if no improvement in cough in next 2 weeks  Please call us if you have new issues that need to be addressed before your next appt.  Your physician wants you to follow-up in: 6 months.  You will receive a reminder letter in the mail two months in advance. If you don't receive a letter, please call our office to schedule the follow-up appointment.

## 2012-12-15 NOTE — Assessment & Plan Note (Addendum)
Etiology of her cough is not clear. We have suggested she take torsemide 60 mg twice a day for several days to see if this will help her symptoms. Alternative would be to take metolazone 5 mg in the morning 30 minutes before she takes her torsemide for extra diuresis. This can be done on an as-needed basis for symptoms of shortness of breath or cough.  If no improvement in her symptoms over the next several days, and if symptoms get worse concerning for upper respiratory infection, she could start a Z-Pak.  We have refilled her albuterol. Only other option would be a prednisone taper if symptoms do not improve for possible COPD exacerbation

## 2012-12-15 NOTE — Assessment & Plan Note (Signed)
Appears relatively euvolemic on today's visit but she does have a cough. We'll increase her diuresis over the next several days to exclude CHF as a cause of her cough

## 2013-03-14 ENCOUNTER — Other Ambulatory Visit: Payer: Self-pay | Admitting: Cardiovascular Disease

## 2013-05-11 ENCOUNTER — Ambulatory Visit: Payer: Self-pay | Admitting: Family Medicine

## 2013-05-20 ENCOUNTER — Ambulatory Visit: Payer: Self-pay | Admitting: Family Medicine

## 2013-05-26 ENCOUNTER — Ambulatory Visit: Payer: Self-pay | Admitting: Family Medicine

## 2013-05-27 DIAGNOSIS — I509 Heart failure, unspecified: Secondary | ICD-10-CM | POA: Insufficient documentation

## 2013-05-27 DIAGNOSIS — I10 Essential (primary) hypertension: Secondary | ICD-10-CM | POA: Insufficient documentation

## 2013-05-27 DIAGNOSIS — M129 Arthropathy, unspecified: Secondary | ICD-10-CM | POA: Insufficient documentation

## 2013-06-16 ENCOUNTER — Ambulatory Visit (INDEPENDENT_AMBULATORY_CARE_PROVIDER_SITE_OTHER): Payer: BC Managed Care – PPO | Admitting: Cardiovascular Disease

## 2013-06-16 ENCOUNTER — Encounter: Payer: Self-pay | Admitting: Cardiovascular Disease

## 2013-06-16 VITALS — BP 110/74 | HR 75 | Ht 65.0 in | Wt 240.5 lb

## 2013-06-16 DIAGNOSIS — R0602 Shortness of breath: Secondary | ICD-10-CM

## 2013-06-16 DIAGNOSIS — N289 Disorder of kidney and ureter, unspecified: Secondary | ICD-10-CM

## 2013-06-16 DIAGNOSIS — M79606 Pain in leg, unspecified: Secondary | ICD-10-CM

## 2013-06-16 DIAGNOSIS — I428 Other cardiomyopathies: Secondary | ICD-10-CM

## 2013-06-16 DIAGNOSIS — M79609 Pain in unspecified limb: Secondary | ICD-10-CM

## 2013-06-16 DIAGNOSIS — F17200 Nicotine dependence, unspecified, uncomplicated: Secondary | ICD-10-CM

## 2013-06-16 NOTE — Assessment & Plan Note (Signed)
I'm concerned about dehydration given her renal dysfunction which is acute. Recommended she hold her torsemide for several days prior to any lab work testing, consider decreasing her torsemide down to 40 mg daily. Would hold off on any metolazone.

## 2013-06-16 NOTE — Progress Notes (Signed)
Patient ID: Charlene Schmitt, female    DOB: 1955-09-28, 58 y.o.   MRN: [id number]  HPI Comments: Charlene Schmitt is a pleasant 58 year old woman with history of obesity, hypertension, history of nonischemic cardiomyopathy, ejection fraction less than 25%, cardiac catheterization showing no significant coronary artery disease, normal right ventricular systolic pressures on echocardiogram. She presents for routine follow up.   Previously tried on ACE inhibitors with a cough, changed to losartan  She had acute on chronic systolic CHF on Lasix and was changed to torsemide twice a day with better results. On torsemide 40 mg twice a day, she has been stable. She does drink a significant amount of water  In followup today, she reports that she has had recent renal dysfunction. In December 2013 creatinine 0.8. In March 2015 creatinine 1.6 per the notes. Now more than 2. Most recent lab work from the end of April 2015 not available. She has been taking ibuprofen for leg and back pain on a regular basis. Also taking extra metolazone as needed with her twice a day torsemide for sense of fullness in her chest, no leg edema and no dramatic weight gain. She's never decreased the dose of torsemide. Blood pressure typically in the 120-130 range systolic  Recent renal ultrasound 05/29/2011 showing no hydronephrosis Recently seen by renal service at [facility], lab work pending for renal dysfunction  Recent lab work showing total cholesterol 196, LDL 116, HDL 50 EKG shows normal sinus rhythm with rate of 75 beats per minute with nonspecific T wave abnormality in V1 through V6   Outpatient Encounter Prescriptions as of 06/16/2013  Medication Sig  . albuterol (PROVENTIL HFA;VENTOLIN HFA) 108 (90 BASE) MCG/ACT inhaler Inhale 2 puffs into the lungs every 6 (six) hours as needed.  Marland Kitchen aspirin 81 MG tablet Take 81 mg by mouth daily.  . carvedilol (COREG) 6.25 MG tablet TAKE 1 TABLET (6.25 MG TOTAL) BY MOUTH 2 (TWO) TIMES  DAILY WITH A MEAL.  Marland Kitchen Fluticasone-Salmeterol (ADVAIR DISKUS) 250-50 MCG/DOSE AEPB Inhale 1 puff into the lungs as needed.   Marland Kitchen ibuprofen (ADVIL,MOTRIN) 200 MG tablet Take 200 mg by mouth every 6 (six) hours as needed.  . isosorbide mononitrate (IMDUR) 30 MG 24 hr tablet TAKE 1 TABLET (30 MG TOTAL) BY MOUTH 2 (TWO) TIMES DAILY.  Marland Kitchen levocetirizine (XYZAL) 5 MG tablet Take 5 mg by mouth daily.   Marland Kitchen losartan (COZAAR) 50 MG tablet Take 1 tablet (50 mg total) by mouth daily.  . metolazone (ZAROXOLYN) 5 MG tablet Take 1 tablet (5 mg total) by mouth daily as needed.  Marland Kitchen omeprazole (PRILOSEC) 20 MG capsule Take 20 mg by mouth daily.  . potassium chloride SA (K-DUR,KLOR-CON) 20 MEQ tablet Take 20 mEq by mouth 2 (two) times daily.  Marland Kitchen torsemide (DEMADEX) 20 MG tablet Take 40 mg by mouth 2 (two) times daily.    Review of Systems  Constitutional: Negative.   HENT: Negative.   Eyes: Negative.   Cardiovascular: Negative.   Gastrointestinal: Negative.   Endocrine: Negative.   Musculoskeletal: Negative.   Skin: Negative.   Allergic/Immunologic: Negative.   Neurological: Negative.   Hematological: Negative.   Psychiatric/Behavioral: Negative.   All other systems reviewed and are negative.   BP 110/74  Ht 5\' 5"  (1.651 m)  Wt 240 lb 8 oz (109.09 kg)  BMI 40.02 kg/m2  Physical Exam  Nursing note and vitals reviewed. Constitutional: She is oriented to person, place, and time. She appears well-developed and well-nourished.  obese  HENT:  Head: Normocephalic.  Nose: Nose normal.  Mouth/Throat: Oropharynx is clear and moist.  Eyes: Conjunctivae are normal. Pupils are equal, round, and reactive to light.  Neck: Normal range of motion. Neck supple. No JVD present.  Cardiovascular: Normal rate, regular rhythm, S1 normal, S2 normal, normal heart sounds and intact distal pulses.  Exam reveals no gallop and no friction rub.   No murmur heard. Pulmonary/Chest: Effort normal and breath sounds normal. No  respiratory distress. She has no wheezes. She has no rales. She exhibits no tenderness.  Abdominal: Soft. Bowel sounds are normal. She exhibits no distension. There is no tenderness.  Musculoskeletal: Normal range of motion. She exhibits no edema and no tenderness.  Lymphadenopathy:    She has no cervical adenopathy.  Neurological: She is alert and oriented to person, place, and time. Coordination normal.  Skin: Skin is warm and dry. No rash noted. No erythema.  Psychiatric: She has a normal mood and affect. Her behavior is normal. Judgment and thought content normal.    Assessment and Plan

## 2013-06-16 NOTE — Patient Instructions (Signed)
You are doing well. Try tramadol as needed for leg and back pain  Hold your lasix for a few days before next blood draw, drink fluids  Please call us if you have new issues that need to be addressed before your next appt.  Your physician wants you to follow-up in: 6 months.  You will receive a reminder letter in the mail two months in advance. If you don't receive a letter, please call our office to schedule the follow-up appointment.

## 2013-06-16 NOTE — Assessment & Plan Note (Signed)
Relatively euvolemic over the past several office visits. No medication changes made apart from decreasing her diuretic dosing

## 2013-06-16 NOTE — Assessment & Plan Note (Signed)
No significant shortness of breath on today's visit. Has done better in general on torsemide.

## 2013-06-16 NOTE — Addendum Note (Signed)
Addended by: Antonieta Iba on: 06/16/2013 04:21 PM   Modules accepted: Orders

## 2013-06-16 NOTE — Assessment & Plan Note (Signed)
We have encouraged her to continue to work on weaning her cigarettes and smoking cessation. She will continue to work on this and does not want any assistance with chantix.  

## 2013-06-16 NOTE — Assessment & Plan Note (Signed)
She reports periodic leg pain, sometimes at rest, sometimes with exertion. She's been taking high-dose ibuprofen. Now can no longer take this secondary to renal dysfunction. Prescription of tramadol was given. She is concerned about blockages in her legs. ABI with exercise ordered

## 2013-06-17 ENCOUNTER — Other Ambulatory Visit (HOSPITAL_COMMUNITY): Payer: Self-pay | Admitting: *Deleted

## 2013-06-17 ENCOUNTER — Other Ambulatory Visit: Payer: Self-pay

## 2013-06-17 DIAGNOSIS — M79606 Pain in leg, unspecified: Secondary | ICD-10-CM

## 2013-06-17 DIAGNOSIS — I70219 Atherosclerosis of native arteries of extremities with intermittent claudication, unspecified extremity: Secondary | ICD-10-CM

## 2013-06-22 ENCOUNTER — Encounter (INDEPENDENT_AMBULATORY_CARE_PROVIDER_SITE_OTHER): Payer: BC Managed Care – PPO

## 2013-06-22 DIAGNOSIS — I739 Peripheral vascular disease, unspecified: Secondary | ICD-10-CM

## 2013-06-22 DIAGNOSIS — I70219 Atherosclerosis of native arteries of extremities with intermittent claudication, unspecified extremity: Secondary | ICD-10-CM

## 2013-10-04 ENCOUNTER — Other Ambulatory Visit: Payer: Self-pay | Admitting: Cardiovascular Disease

## 2013-10-05 NOTE — Telephone Encounter (Signed)
Refused refill for Tramadol.

## 2014-02-08 ENCOUNTER — Other Ambulatory Visit: Payer: Self-pay | Admitting: Cardiovascular Disease

## 2014-02-14 ENCOUNTER — Ambulatory Visit: Payer: BC Managed Care – PPO | Admitting: Cardiovascular Disease

## 2014-03-09 ENCOUNTER — Encounter: Payer: Self-pay | Admitting: Cardiovascular Disease

## 2014-03-09 ENCOUNTER — Ambulatory Visit (INDEPENDENT_AMBULATORY_CARE_PROVIDER_SITE_OTHER): Payer: No Typology Code available for payment source | Admitting: Cardiovascular Disease

## 2014-03-09 VITALS — BP 100/72 | HR 77 | Ht 67.0 in | Wt 228.5 lb

## 2014-03-09 DIAGNOSIS — F17200 Nicotine dependence, unspecified, uncomplicated: Secondary | ICD-10-CM

## 2014-03-09 DIAGNOSIS — Z720 Tobacco use: Secondary | ICD-10-CM

## 2014-03-09 DIAGNOSIS — I429 Cardiomyopathy, unspecified: Secondary | ICD-10-CM

## 2014-03-09 DIAGNOSIS — R0602 Shortness of breath: Secondary | ICD-10-CM

## 2014-03-09 DIAGNOSIS — Z01810 Encounter for preprocedural cardiovascular examination: Secondary | ICD-10-CM

## 2014-03-09 DIAGNOSIS — I428 Other cardiomyopathies: Secondary | ICD-10-CM

## 2014-03-09 NOTE — Assessment & Plan Note (Signed)
She appears relatively euvolemic on today's visit. Recent BMP stable, creatinine 1.1. Some weight gain likely from eating. We'll continue torsemide sliding scale based on her weight

## 2014-03-09 NOTE — Assessment & Plan Note (Signed)
She would be acceptable risk for upcoming colonoscopy. Suggested minimal IV fluids be used given her underlying cardiopathy. Also recommended she not take her morning medications as blood pressure is low and may make it more challenging to get anesthesia

## 2014-03-09 NOTE — Assessment & Plan Note (Signed)
Shortness of breath stable, dramatically improved with aggressive diuresis on torsemide

## 2014-03-09 NOTE — Progress Notes (Signed)
Patient ID: Charlene Schmitt, female    DOB: [birth year], 59 y.o.   MRN: [id number]  HPI Comments: [name] is a pleasant 59 year old woman with history of obesity, hypertension, history of nonischemic cardiomyopathy, ejection fraction less than 25%, cardiac catheterization showing no significant coronary artery disease, normal right ventricular systolic pressures on echocardiogram. She presents for routine follow up for her cardiomyopathy.  She denies any lightheadedness or dizziness. She has a sliding scale for her torsemide based on her weight. Recent basic metabolic panel within normal limits. Creatinine 1.1 Tolerating her medications well. Isosorbide decreased down to 30 mg daily, previously twice a day dosing Overall has no new complaints. Some weight gain Little bit of mid back discomfort for 2 days, now resolved. Uncertain if this is musculoskeletal  EKG on today's visit shows normal sinus rhythm with rate 77 bpm, T-wave abnormality through theanterior precordial leads  Other past medical history  Previous renal dysfunction in the setting of NSAIDs and diuresis renal ultrasound 05/29/2011 showing no hydronephrosis  seen by renal service at [facility]  Total cholesterol now 200    Allergies  Allergen Reactions  . Ace Inhibitors Other (See Comments)    Cough Cough  . Penicillins Rash    Outpatient Encounter Prescriptions as of 03/09/2014  Medication Sig  . albuterol (PROVENTIL HFA;VENTOLIN HFA) 108 (90 BASE) MCG/ACT inhaler Inhale 2 puffs into the lungs every 6 (six) hours as needed.  Marland Kitchen aspirin 81 MG tablet Take 81 mg by mouth daily.  . carvedilol (COREG) 6.25 MG tablet TAKE 1 TABLET (6.25 MG TOTAL) BY MOUTH 2 (TWO) TIMES DAILY WITH A MEAL.  Marland Kitchen Fluticasone-Salmeterol (ADVAIR DISKUS) 250-50 MCG/DOSE AEPB Inhale 1 puff into the lungs as needed.   Marland Kitchen ibuprofen (ADVIL,MOTRIN) 200 MG tablet Take 200 mg by mouth every 6 (six) hours as needed.  . isosorbide mononitrate (IMDUR) 30 MG  24 hr tablet Take 30 mg by mouth daily.  Marland Kitchen levocetirizine (XYZAL) 5 MG tablet Take 5 mg by mouth daily.   Marland Kitchen losartan (COZAAR) 50 MG tablet TAKE 1 TABLET (50 MG TOTAL) BY MOUTH DAILY.  . metolazone (ZAROXOLYN) 5 MG tablet Take 1 tablet (5 mg total) by mouth daily as needed.  . potassium chloride SA (K-DUR,KLOR-CON) 20 MEQ tablet Take 20 mEq by mouth 2 (two) times daily.  Marland Kitchen torsemide (DEMADEX) 20 MG tablet Take 40 mg by mouth 2 (two) times daily.  . traMADol (ULTRAM) 50 MG tablet Take 50 mg by mouth every 6 (six) hours as needed.   . [DISCONTINUED] isosorbide mononitrate (IMDUR) 30 MG 24 hr tablet TAKE 1 TABLET (30 MG TOTAL) BY MOUTH 2 (TWO) TIMES DAILY. (Patient not taking: Reported on 03/09/2014)  . [DISCONTINUED] omeprazole (PRILOSEC) 20 MG capsule Take 20 mg by mouth daily.    Past Medical History  Diagnosis Date  . 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 Hypertension   . Acute systolic heart failure   . Nonischemic cardiomyopathy   . COPD (chronic obstructive pulmonary disease)   . CHF (congestive heart failure)     Past Surgical History  Procedure Laterality Date  . Ganglion cyst excision      right hand   . Laceration repair      finger; s/p stitches  . Cardiac catheterization  11/19/2011  . Tooth extraction      x2    Social History  reports that she has been smoking Cigarettes.  She has a 20 pack-year smoking history. She does not have  any smokeless tobacco history on file. She reports that she drinks alcohol. She reports that she does not use illicit drugs.  Family History family history includes Hypertension in her father and mother.   Review of Systems  Constitutional: Negative.   HENT: Negative.   Respiratory: Negative.   Cardiovascular: Negative.   Gastrointestinal: Negative.   Musculoskeletal: Negative.   Skin: Negative.   Neurological: Negative.   Hematological: Negative.   Psychiatric/Behavioral: Negative.   All other systems  reviewed and are negative.   BP 100/72 mmHg  Pulse 77  Ht 5\' 7"  (1.702 m)  Wt 228 lb 8 oz (103.647 kg)  BMI 35.78 kg/m2  Physical Exam  Constitutional: She is oriented to person, place, and time. She appears well-developed and well-nourished.  obese  HENT:  Head: Normocephalic.  Nose: Nose normal.  Mouth/Throat: Oropharynx is clear and moist.  Eyes: Conjunctivae are normal. Pupils are equal, round, and reactive to light.  Neck: Normal range of motion. Neck supple. No JVD present.  Cardiovascular: Normal rate, regular rhythm, S1 normal, S2 normal, normal heart sounds and intact distal pulses.  Exam reveals no gallop and no friction rub.   No murmur heard. Pulmonary/Chest: Effort normal and breath sounds normal. No respiratory distress. She has no wheezes. She has no rales. She exhibits no tenderness.  Abdominal: Soft. Bowel sounds are normal. She exhibits no distension. There is no tenderness.  Musculoskeletal: Normal range of motion. She exhibits no edema or tenderness.  Lymphadenopathy:    She has no cervical adenopathy.  Neurological: She is alert and oriented to person, place, and time. Coordination normal.  Skin: Skin is warm and dry. No rash noted. No erythema.  Psychiatric: She has a normal mood and affect. Her behavior is normal. Judgment and thought content normal.    Assessment and Plan  Nursing note and vitals reviewed.

## 2014-03-09 NOTE — Assessment & Plan Note (Signed)
We have encouraged her to continue to work on weaning her cigarettes and smoking cessation. She will continue to work on this and does not want any assistance with chantix.  

## 2014-03-09 NOTE — Patient Instructions (Signed)
You are doing well. No medication changes were made.  Ok for colonoscopy Minimize IVF during the procedure Do not take BP meds the morning of the colo  If you get dizzy with standing, cut the losartan in 1/2 daily (25 mg)  Please call us if you have new issues that need to be addressed before your next appt.  Your physician wants you to follow-up in: 6 months.  You will receive a reminder letter in the mail two months in advance. If you don't receive a letter, please call our office to schedule the follow-up appointment.

## 2014-04-15 ENCOUNTER — Ambulatory Visit: Payer: Self-pay | Admitting: Gastroenterology

## 2014-05-17 NOTE — Consult Note (Signed)
General Aspect Dyspnea    Present Illness The patient has no prior history of heart disease.  However, she has had HTN that she has not treated. She has had progressive dyspnea particulary over the last 10 days.  She works as a Quarry manager and has been getting SOB with activities.  However, she says her dyspnea is worse at night and she describes true PND.  She does not report chest pain, neck or arm pain.  She does not have weight gain or edema.  She presented to the ER because her dyspnea was progressively worse.  She did not have an acute exacerbation.  The initial BP in the ER was listed as 155/122.  Her BNP was elevated.  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  She lives with a son and grandchildren.  She smokes less than 1/2 pack of cigarettes per day and has smoked for greater than 20 years.  She does not drink alcohol.  FAMILY HISTORY:  She has a strong family history of severe HTN.  There is no history of CAD.  Multiple family members including brothers and sisters have CHF.   Physical Exam:   GEN well developed, no acute distress    HEENT hearing intact to voice, moist oral mucosa, Upper dentures    NECK supple  thyroid not tender  thyroid tender  trachea midline    RESP normal resp effort  clear BS  no use of accessory muscles    CARD Regular rate and rhythm  Normal, S1, S2  S4  No murmur    ABD denies tenderness  denies Flank Tenderness  soft  normal BS  no Abdominal Bruits  Obese    LYMPH negative neck    EXTR negative cyanosis/clubbing, negative edema    SKIN normal to palpation, No rashes    NEURO cranial nerves intact, motor/sensory function intact    PSYCH alert, A+O to time, place, person   Review of Systems:   General: No Complaints    Skin: No Complaints    ENT: No Complaints    Respiratory: Short of breath  Rare cough nonproductive    Cardiovascular: Palpitations  Rare palpitations, no syncope    Gastrointestinal: No Complaints    Vascular: No  Complaints    Musculoskeletal: No Complaints    Neurologic: No Complaints    Psychiatric: No Complaints    Review of Systems: All other systems were reviewed and found to be negative    Medications/Allergies Reviewed Medications/Allergies reviewed     Bronchitis:    HTN:   Home Medications: Medication Instructions Status  ibuprofen 200 mg oral tablet 3 to 4 tab(s) orally every 6 to 8 hours as needed for joint pain  Active  Allegra 24 Hour Allergy oral tablet 1 tab(s) orally once a day Active  Advair Diskus 250 mcg-50 mcg inhalation powder 1 puff(s) inhaled 1 to 2 times a day Active  Ventolin HFA 90 mcg/inh inhalation aerosol 2 puff(s) inhaled every 4 to 6 hours as needed for shortness of breath/ wheezing.  Active   Lab Results: Hepatic:  20-Oct-13 12:54    Bilirubin, Total  1.1   Alkaline Phosphatase 80   SGPT (ALT) 55   SGOT (AST)  45   Total Protein, Serum 7.2   Albumin, Serum 3.6  Routine Chem:  20-Oct-13 12:54    B-Type Natriuretic Peptide [facility])  2989 (Result(s) reported on 17 Nov 2011 at 01:34PM.)   Glucose, Serum 95  BUN 12   Creatinine (comp) 0.77   Sodium, Serum 142   Potassium, Serum 4.1   Chloride, Serum  110   CO2, Serum 24   Calcium (Total), Serum 9.2   Osmolality (calc) 283   eGFR (African American) >60   eGFR (Non-African American) >60 (eGFR values <23m/min/1.73 m2 may be an indication of chronic kidney disease (CKD). Calculated eGFR is useful in patients with stable renal function. The eGFR calculation will not be reliable in acutely ill patients when serum creatinine is changing rapidly. It is not useful in  patients on dialysis. The eGFR calculation may not be applicable to patients at the low and high extremes of body sizes, pregnant women, and vegetarians.)   Anion Gap 8   Result Comment TROPONIN - RESULTS VERIFIED BY REPEAT TESTING.  - C/DENIA ROYSTER AT 1341 11/17/11..Marland KitchenLAB  - READ-BACK PROCESS PERFORMED.  Result(s) reported on 17 Nov 2011 at 01:41PM.  Cardiac:  20-Oct-13 12:54    CK, Total 131   CPK-MB, Serum 1.4 (Result(s) reported on 17 Nov 2011 at 01:34PM.)   Troponin I  0.11 (0.00-0.05 0.05 ng/mL or less: NEGATIVE  Repeat testing in 3-6 hrs  if clinically indicated. >0.05 ng/mL: POTENTIAL  MYOCARDIAL INJURY. Repeat  testing in 3-6 hrs if  clinically indicated. NOTE: An increase or decrease  of 30% or more on serial  testing suggests a  clinically important change)  Routine Hem:  20-Oct-13 12:54    WBC (CBC) 5.0   RBC (CBC) 4.82   Hemoglobin (CBC) 14.1   Platelet Count (CBC) 265 (Result(s) reported on 17 Nov 2011 at 01:12PM.)   MCV 89   MCH 29.4   MCHC 33.2   RDW  15.2   EKG:   EKG Interp. by me    Interpretation NSR, rate 92, ventricular ectopy, LVH wtih repolarization abnormalities.  Poor anterior R wave progression    Penicillin: Rash  Vital Signs/Nurse's Notes: **Vital Signs.:   20-Oct-13 15:38   Vital Signs Type Admission   Temperature Temperature (F) 98.1   Celsius 36.7   Temperature Source oral   Pulse Pulse 71   Respirations Respirations 18   Systolic BP Systolic BP 1161  Diastolic BP (mmHg) Diastolic BP (mmHg) 81   Mean BP 109   Pulse Ox % Pulse Ox % 95   Pulse Ox Activity Level  At rest   Oxygen Delivery Room Air/ 21 %     Impression CHF: I agree that dyspnea is related to CHF.  Continue diuresis.  Echo ordered.  Further plans about cath vs noninvasive evaluation to rule out CAD will be based on the echo results.    HTN: The patient has not taken meds but has known about her HTN.  We discussed this.  We discussed salt restriction.  I agree with the current choice of ACE inhibitor and beta blocker  Tobacco: Educate  Elevated Troponin: This is likely secondary to her HTN.  CK and MB are normal.  I might suggest a noninvasive evaluation if the Echo EF and wall motion is normal and cath otherwise.   Electronic Signatures: HMinus Breeding(MD)  (Signed 20-Oct-13  16:17)  Authored: General Aspect/Present Illness, History and Physical Exam, Review of System, Past Medical History, Home Medications, Labs, EKG , Allergies, Vital Signs/Nurse's Notes, Impression/Plan   Last Updated: 20-Oct-13 16:17 by HMinus Breeding(MD)

## 2014-05-17 NOTE — Discharge Summary (Signed)
PATIENT NAME:  Charlene Schmitt, BURUM MR#:  [id number] DATE OF BIRTH:  1955-11-16  DATE OF ADMISSION:  11/17/2011 DATE OF DISCHARGE:  11/19/2011  DISCHARGE DIAGNOSES:  1. Acute systolic heart failure.  2. Nonischemic cardiomyopathy. 3. Chronic obstructive pulmonary disease and recent bronchitis.   DISCHARGE MEDICATIONS:  1. Motrin  200 mg  every 6 hours. 2. Allegra 24 once daily.  3. Advair Diskus 250/50, 1 puff b.i.d.  4. Ventolin 90 mcg, 2 puffs every 4 to 6 hours. 5. Aspirin 81 mg p.o. a day. 6. Coreg 3.125 mg p.o. b.i.d.   7. KCl 20 mEq p.o. b.i.d.  8. Lasix 20 mg p.o. b.i.d.  9. Lisinopril 20 mg p.o. daily.   DISCHARGE INSTRUCTIONS AND FOLLOWUP:   1. Low sodium diet.  2. Check daily weights.  3. Follow up with [facility] in 2 weeks.    CONSULTATION: Cardiology consult with Dr. Dossie Arbour.   HOSPITAL COURSE: The patient is a 59 year old female with history of tobacco abuse, no known history of coronary artery disease or hypertension, came in because of orthopnea, PND, and pedal edema. The patient was admitted for acute congestive heart failure. The patient's echocardiogram showed severely decreased LV function with ejection fraction less than 25%. The patient's troponins were elevated at 2.11, BNP was 2988, and other lab data were normal. The patient's CK total and CPK-MB were normal. Because of acute chf,, she was taken to cardiac catheterization this morning, and cardiac catheterization shows  normal coronaries with no evidence of ischemia, so Dr. Mariah Milling suggested  she can be discharged . Marland Kitchen The patient was started  on Coreg,   lisinopril and Lasix, and she can follow up with Dr. Mariah Milling in 1 to 2  weeks.   DISCHARGE VITAL SIGNS: Temperature 97.9, pulse 72, respirations 18, blood pressure 117/66, saturation 94% on room air.   TIME SPENT ON DISCHARGE: More than 30 minutes.   ____________________________ Katha Hamming, MD sk:cbb D: 11/19/2011 21:42:06 ET T: 11/20/2011 10:19:22  ET JOB#: [id number]  cc: Katha Hamming, MD, <Dictator> Katha Hamming MD ELECTRONICALLY SIGNED 12/10/2011 15:52

## 2014-05-17 NOTE — H&P (Signed)
PATIENT NAME:  Charlene Schmitt, Charlene Schmitt MR#:  [id number] DATE OF BIRTH:  Jun 28, 1955  DATE OF ADMISSION:  11/17/2011  ER REFERRING PHYSICIAN: Janalyn Harder, MD    PRIMARY CARE PHYSICIAN: None.  CHIEF COMPLAINT: Shortness of breath, orthopnea, paroxysmal nocturnal dyspnea.   HISTORY OF PRESENT ILLNESS: The patient is a 59 year old female with no past medical history other than smoking, family history of hypertension, diabetes, who reports that for about 1-1/2 weeks she has been gaining weight, having some leg swelling. She has been short of breath especially at night and describes symptoms of orthopnea and PND. She denies any headache, lightheadedness, dizziness, change in her vision. She has also been having some retrosternal pressure which is described as feeling stopped up. She has not slept well in the last week, therefore she went to the Urgent Care in [facility] where she was found to have EKG changes showing left ventricular hypertrophy and chest x-ray findings of congestive heart failure. Therefore, she was sent to the Emergency Room for further evaluation. She smokes 1 pack per day and has a family history of PVD, hypertension, and diabetes.   ALLERGIES: Penicillin.    PAST MEDICAL HISTORY: None.   MEDICATIONS:   1. Advair 250/150, 1 puff 1 to 2 times a day. 2. Allegra as needed. 3. Ibuprofen as needed. 4. Ventolin HFA 2 puffs every 4 to 6 hours p.r.n. shortness of breath.    PAST SURGICAL HISTORY:  1. Right hand ganglion cyst removal. 2. Two miscarriages with dilatation and curettage.  3. Finger laceration, status post stitches .  SOCIAL HISTORY: She smokes 1 pack per day and has done so for more than 20 years. 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   FAMILY HISTORY: Both parents had hypertension. PVD, arthritis, cancer and diabetes runs in the family.   REVIEW OF SYSTEMS: CONSTITUTIONAL: Denies any fever, fatigue, weakness. EYES: Denies any blurred or  double vision. ENT: Denies any tinnitus or ear pain. RESPIRATORY: Reports a dry cough with clear expectoration, denies any wheezing. CARDIOVASCULAR: Reports some retrosternal chest discomfort, dyspnea on exertion. GASTROINTESTINAL: Denies any nausea, vomiting, diarrhea, or abdominal pain. GU: Denies any dysuria, hematuria. ENDOCRINE: Denies any polyuria or nocturia. HEME/LYMPH: Denies any anemia or easy bruisability. INTEGUMENTARY:  Denies any acne or rash. MUSCULOSKELETAL: Denies any swelling, gout. She has some osteoarthritis. NEUROLOGICAL: Denies any numbness or weakness. PSYCHIATRIC: Denies any anxiety or depression.   PHYSICAL EXAMINATION:  VITAL SIGNS: Temperature 99, heart rate 24, respiratory 135/122, pulse oximetry 98% on room air.   GENERAL: The patient is a 59 year old African American female. obese .lying in bed, not in acute distress.   HEENT: Head: Atraumatic, normocephalic. EYES: No pallor, icterus or cyanosis. Pupils are equal, round, and reactive to light and accommodation. Extraocular movements are intact. ENT: Wet mucous membranes. No oropharyngeal erythema or thrush.   NECK: Supple. No masses. No JVD. No thyromegaly or lymphadenopathy.   CHEST WALL: No tenderness to palpation. Not using accessory muscles of respiration. No intercostal muscle retractions.   LUNGS: Bilateral basilar crepitation, no wheezing or rhonchi.   CARDIOVASCULAR: S1, S2 regular, no murmurs, rubs or gallops.   ABDOMEN: Soft, nontender, nondistended. No guarding. No rigidity. Obese with normal bowel sounds.   SKIN: No rashes or lesions.   PERIPHERIES: There is some pedal edema, 2+ pedal pulses.   MUSCULOSKELETAL: No cyanosis or clubbing.   NEUROLOGIC: Awake, alert, oriented x3. Nonfocal neurological exam. Cranial nerves are grossly intact.   PSYCHIATRIC:  Normal mood and affect.   LABORATORY, DIAGNOSTIC AND RADIOLOGICAL DATA: BNP 2989. CK 131. Troponin 0.11. CBC normal. Complete metabolic panel  normal other than chloride of 110, bilirubin 1.1, AST 45. Chest x-ray shows low-grade congestive heart failure with interstitial edema. No focal pneumonia.   ASSESSMENT/PLAN: A 59 year old female with history of smoking, presents with, edema, PND, orthopnea,   1. Possible new onset congestive heart failure: The patient has some basal crepitations. Her BNP is elevated. Her chest x-ray shows some pulmonary vascular congestion. There is a strong possibility that she has underlying undiagnosed long-standing hypertension. We will admit her to the hospital and start her  IV Lasix. We will get a 2-D echocardiogram to determine what kind of congestive heart failure she has.  2. Accelerated hypertension: This is a new diagnosis for the patient. Weill treat with diuretic therapy, low dose ACE inhibitor and beta blocker. We will adjust medications as needed to achieve good hypertensive control.  3. Elevated troponin: Differential diagnoses include NSTEMI versus demand ischemia. The patient reports some retrosternal   discomfort. She does have risk factors of undiagnosed hypertension and smoking. We will check serial cardiac enzymes, start the patient on aspirin, beta blocker. We will obtain a Cardiology consult and do a treadmill stress test.  4. History of smoking: The patient has been counseled about cessation for more than three minutes. We will provide with a nicotine patch.  5. The patient needs a PCP at the time of discharge.   I discussed with the ER physician, discussed with the patient and her family the plan of care and management.   TIME SPENT: 75 minutes.   ____________________________ Darrick Meigs, MD sp:cbb D: 11/17/2011 14:33:51 ET T: 11/17/2011 15:00:27 ET JOB#: [id number]  cc: Darrick Meigs, MD, <Dictator> Darrick Meigs MD ELECTRONICALLY SIGNED 11/17/2011 15:54

## 2014-05-23 LAB — SURGICAL PATHOLOGY

## 2014-07-18 IMAGING — CR DG CHEST 2V
1 series · 2 of 2 positions shown · non-contrast
Comparison: none

REASON FOR EXAM: chest pressure; "swollen ankles"
COMMENTS:   LMP: Post-Menopausal

[Series 1: pa · 0.17mm/px · 2 of 2 slices shown]
[im 1/2]
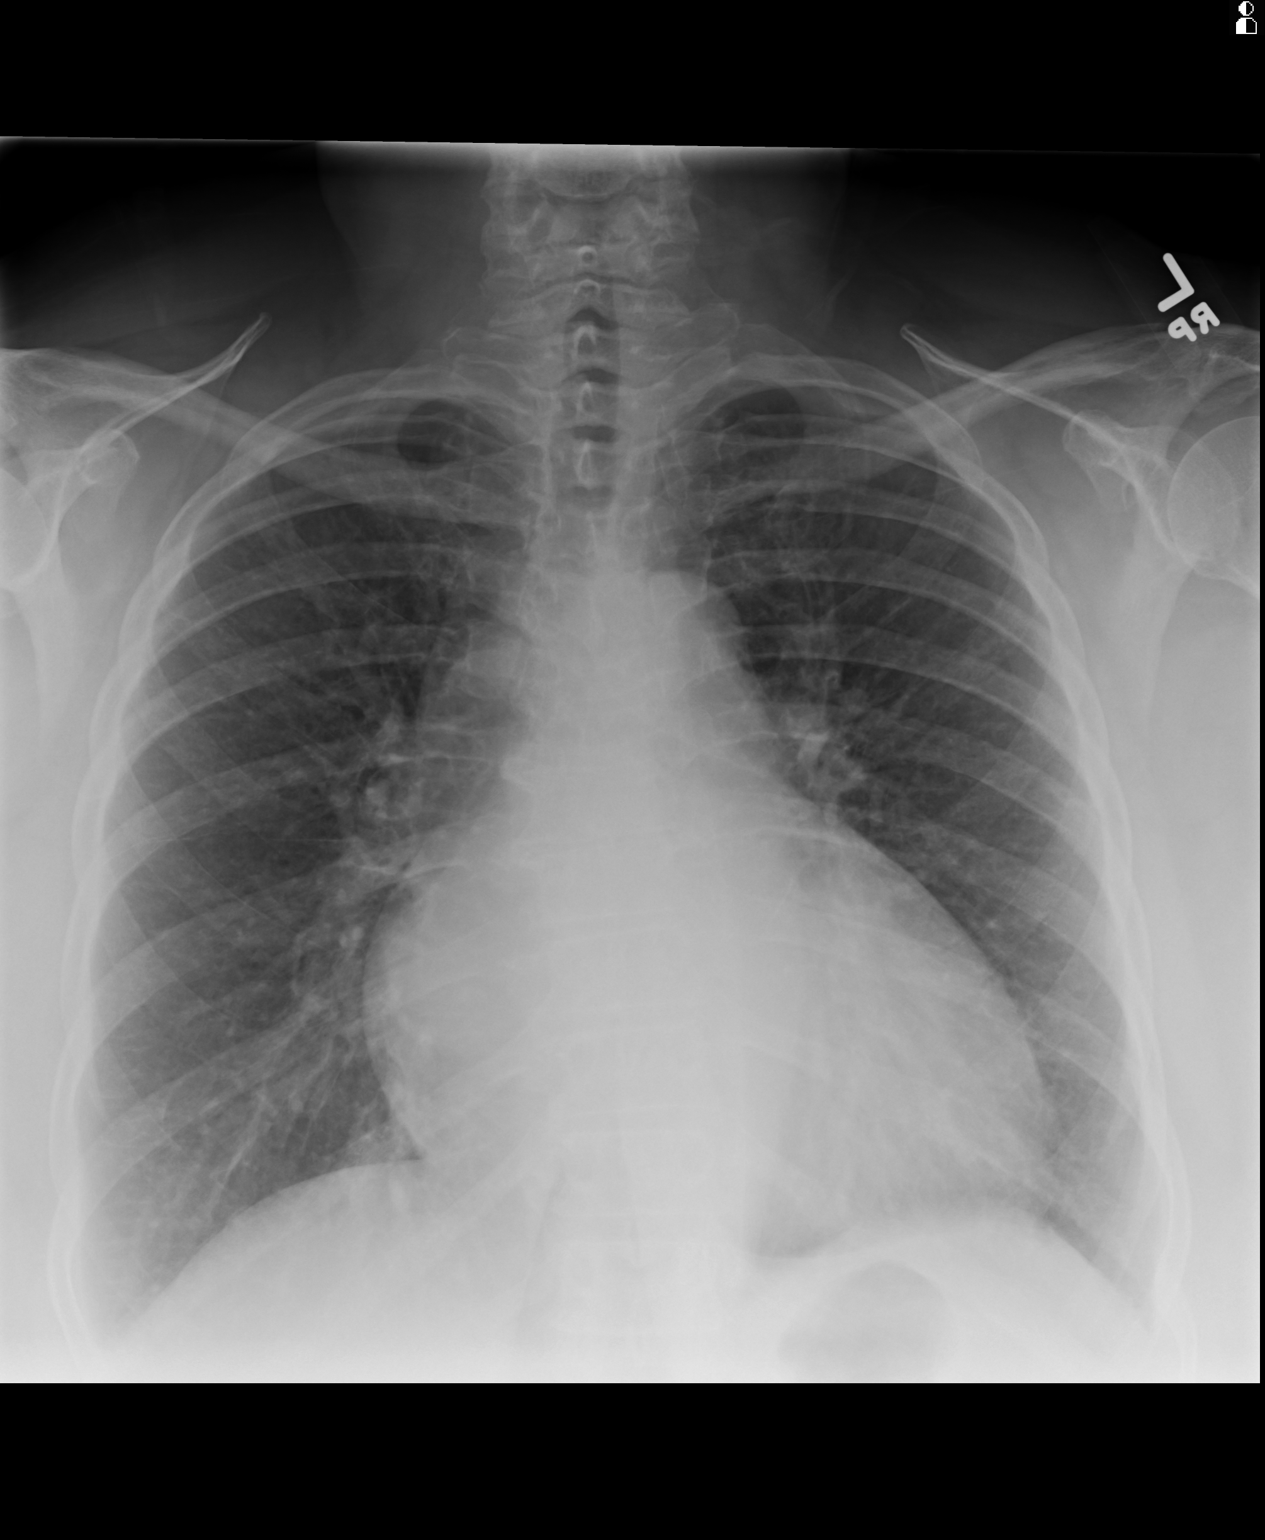
[im 2/2]
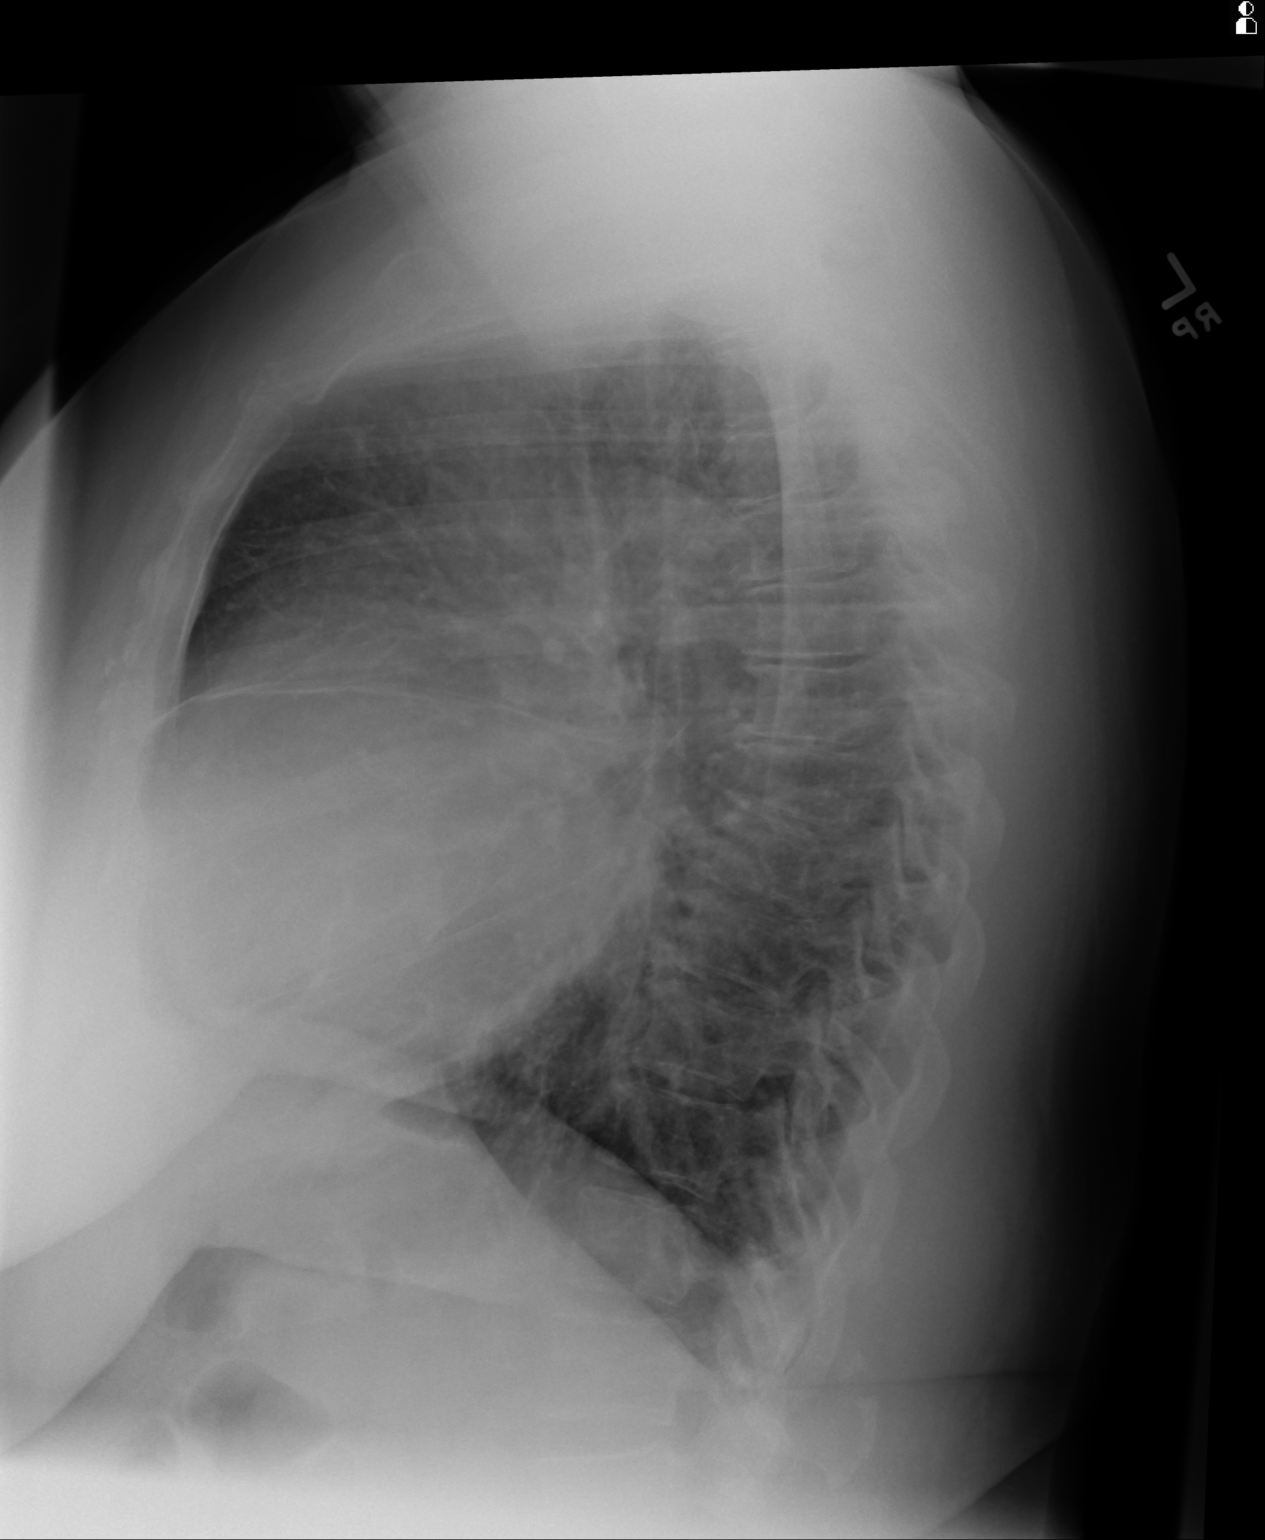

[2 of 2 positions shown; findings below may reference images not displayed]

PROCEDURE:     MDR - MDR CHEST PA(OR AP) AND LATERAL  - November 17, 2011 [DATE]

RESULT:     The lungs are adequately inflated. There is no focal infiltrate.
The interstitial markings are diffusely increased to a mild degree. The
cardiac silhouette is enlarged. The pulmonary vascularity is mildly
prominent centrally. There is no pleural effusion.
IMPRESSION: The findings suggest low-grade CHF with interstitial edema.
There is no focal pneumonia.

[REDACTED]

## 2014-11-30 ENCOUNTER — Telehealth: Payer: Self-pay | Admitting: Cardiovascular Disease

## 2014-11-30 NOTE — Telephone Encounter (Signed)
3 attempts to schedule fu from Recall list.  LMOV to call office for scheduling ° ° °Deleting recall.   °

## 2016-01-19 IMAGING — US US BREAST*L* LIMITED INC AXILLA
1 series · 6 of 6 positions shown · non-contrast
Comparison: 05/11/2013 and prior analog study from 3559

CLINICAL DATA: The patient returns after screening study for
evaluation of the left breast.

EXAM:
DIGITAL DIAGNOSTIC  LEFT MAMMOGRAM
ULTRASOUND LEFT BREAST

[Series 1: us breast*left* limited inc axilla · 0.08mm/px · 6 of 6 slices shown]
[im 1/6]
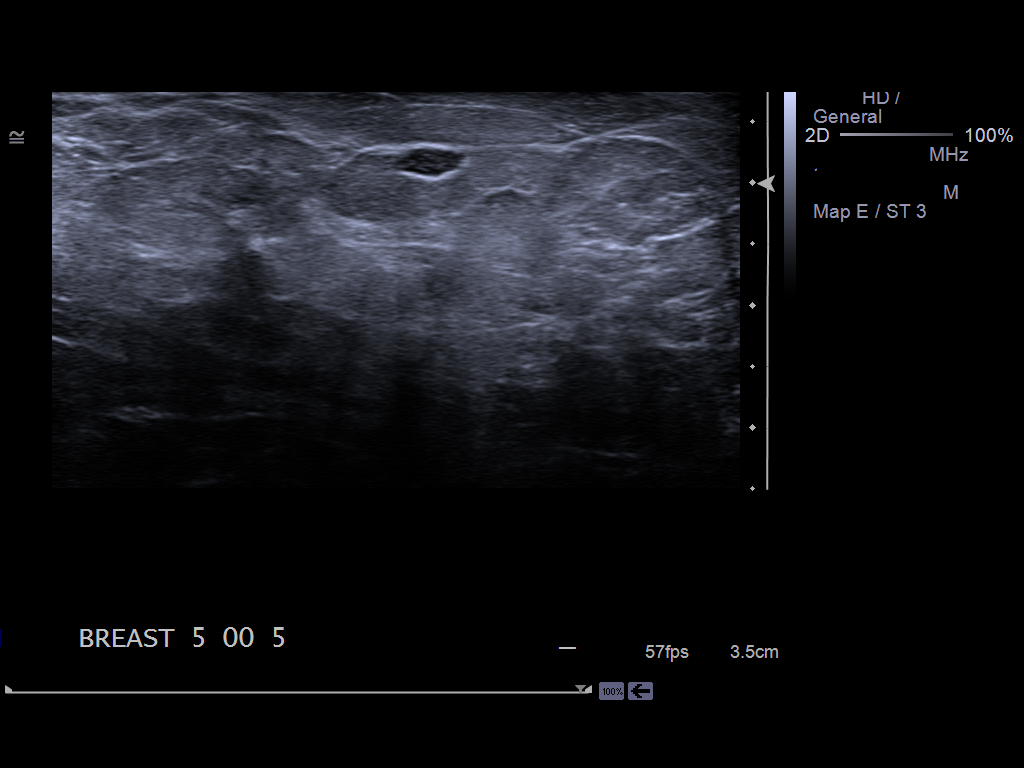
[im 2/6]
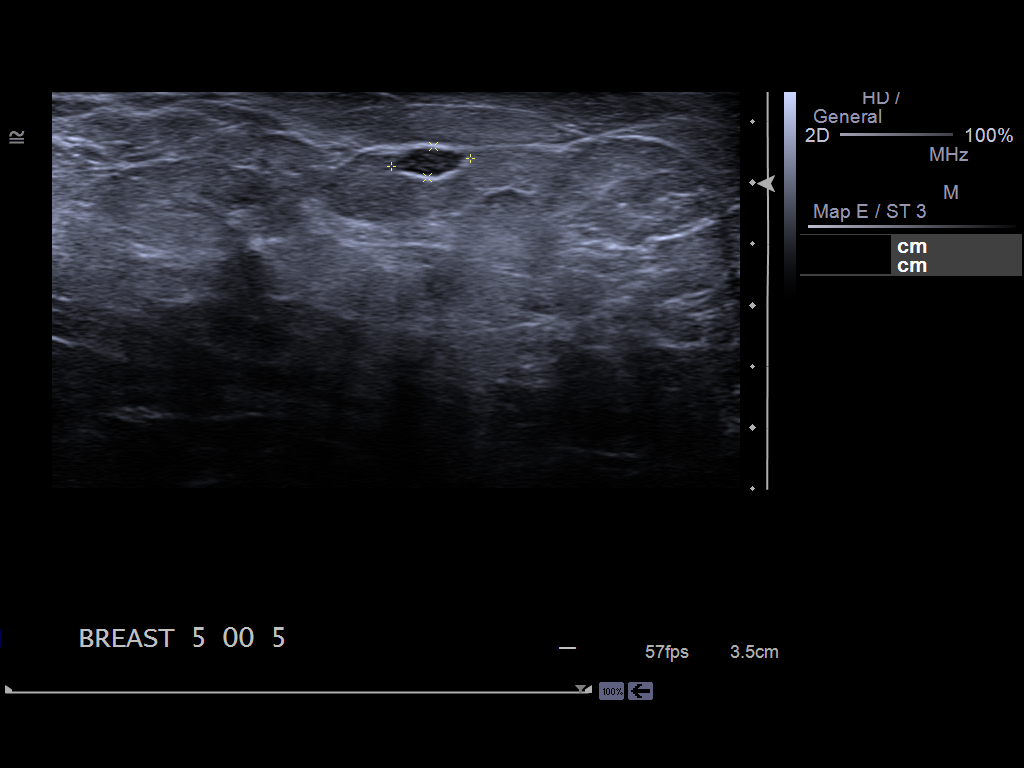
[im 3/6]
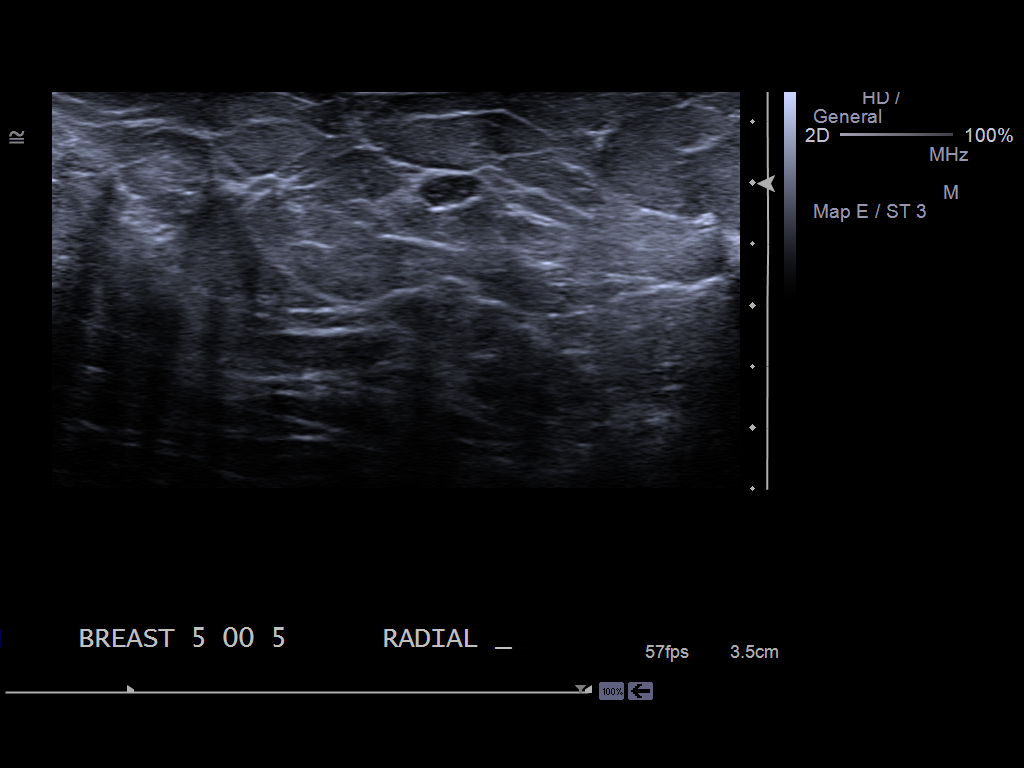
[im 4/6]
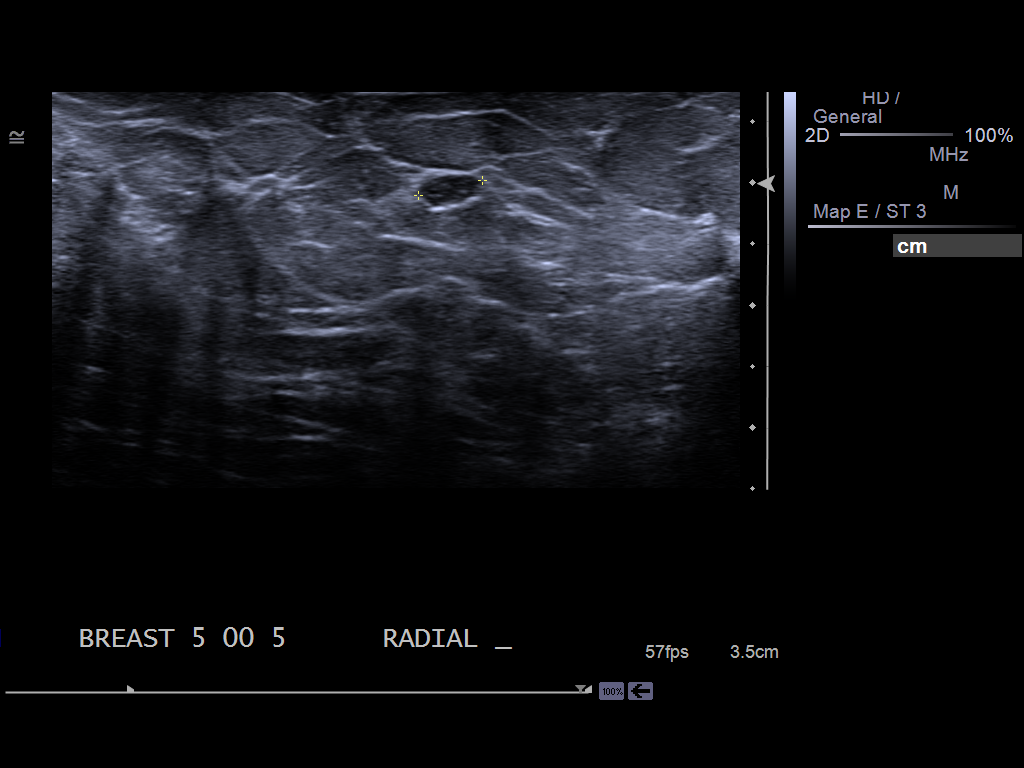
[im 5/6]
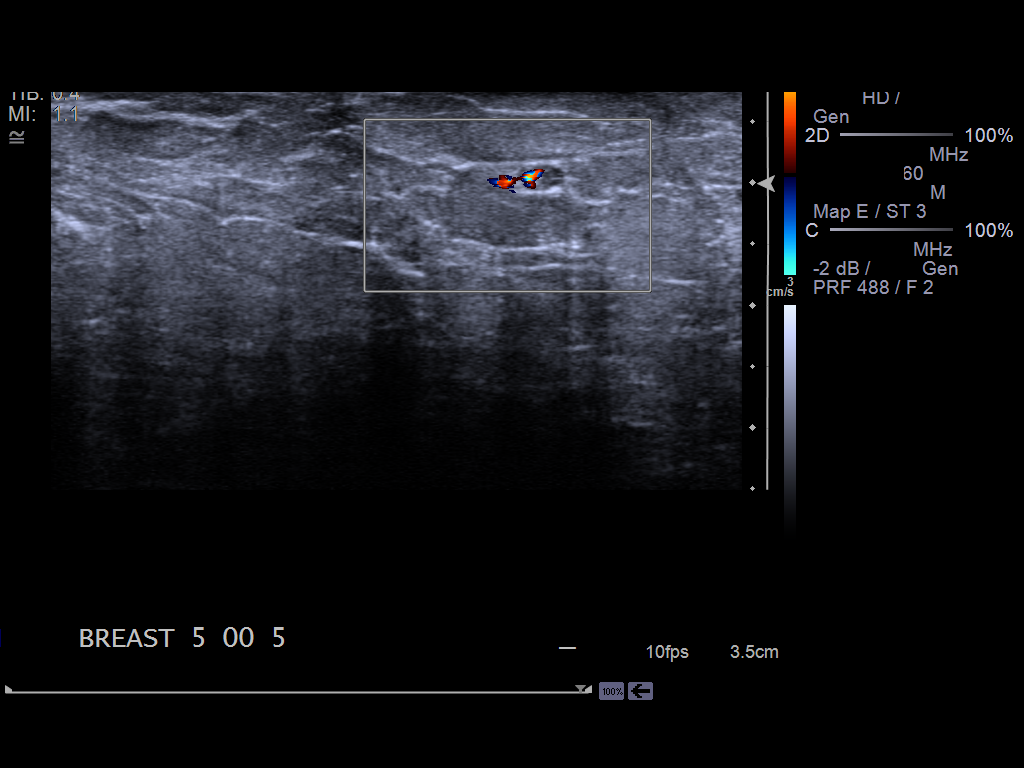
[im 6/6]
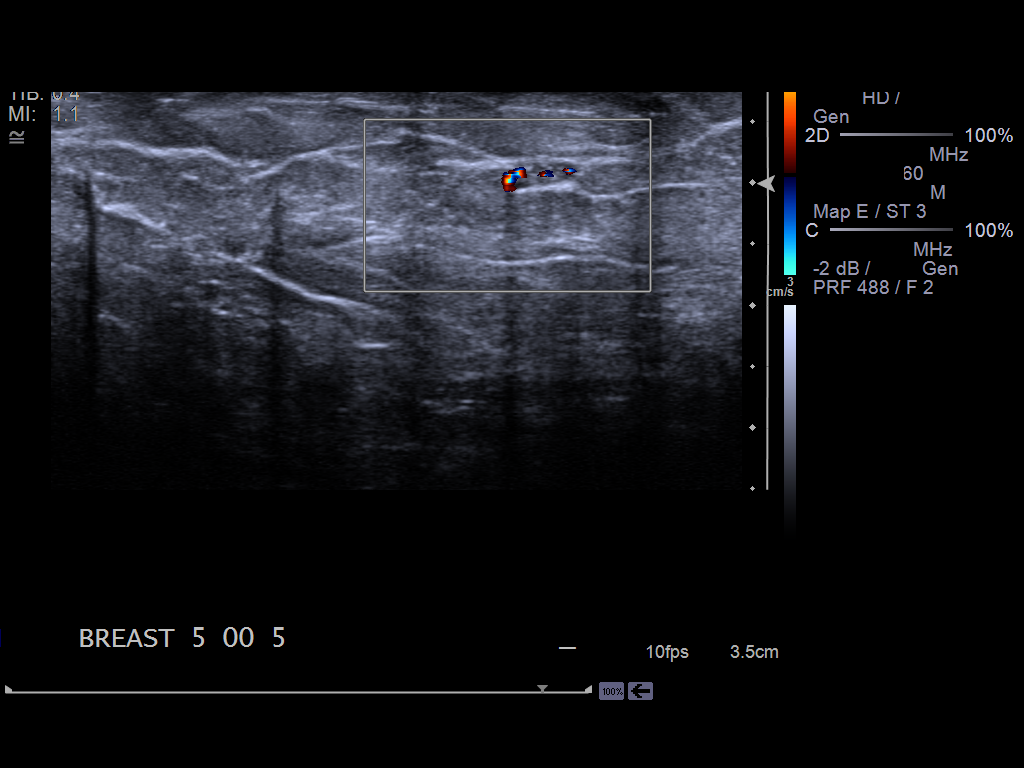

[6 of 6 positions shown; findings below may reference images not displayed]

ACR Breast Density Category b: There are scattered areas of
fibroglandular density.
FINDINGS: Spot compression views confirm presence of a nodular density in the
lower central portion of the left breast.

On physical exam, I palpate no abnormality in the lower central
aspect of the left breast.

Ultrasound is performed, showing a circumscribed hypoechoic nodule
in the 5 o'clock location of the left breast 5 cm from the nipple.
This measures 0.7 x 0.3 x 0.5 cm. There is significant internal
blood flow on Doppler evaluation. Biopsy is recommended to exclude
malignancy.
IMPRESSION: 1. New mass in a postmenopausal patient warranting further
evaluation.
2. Tissue diagnosis is recommended.

RECOMMENDATION:
Left ultrasound-guided core biopsy is suggested.

I have discussed the findings and recommendations with the patient.
Results were also provided in writing at the conclusion of the
visit. If applicable, a reminder letter will be sent to the patient
regarding the next appointment.

BI-RADS CATEGORY  4: Suspicious.

## 2016-02-07 ENCOUNTER — Ambulatory Visit (INDEPENDENT_AMBULATORY_CARE_PROVIDER_SITE_OTHER): Payer: Managed Care, Other (non HMO) | Admitting: Cardiovascular Disease

## 2016-02-07 ENCOUNTER — Ambulatory Visit (INDEPENDENT_AMBULATORY_CARE_PROVIDER_SITE_OTHER): Payer: Managed Care, Other (non HMO)

## 2016-02-07 ENCOUNTER — Encounter: Payer: Self-pay | Admitting: Cardiovascular Disease

## 2016-02-07 VITALS — BP 128/90 | HR 80 | Ht 66.5 in | Wt 224.5 lb

## 2016-02-07 DIAGNOSIS — R9431 Abnormal electrocardiogram [ECG] [EKG]: Secondary | ICD-10-CM | POA: Insufficient documentation

## 2016-02-07 DIAGNOSIS — R0602 Shortness of breath: Secondary | ICD-10-CM | POA: Diagnosis not present

## 2016-02-07 DIAGNOSIS — I493 Ventricular premature depolarization: Secondary | ICD-10-CM

## 2016-02-07 DIAGNOSIS — I428 Other cardiomyopathies: Secondary | ICD-10-CM | POA: Diagnosis not present

## 2016-02-07 DIAGNOSIS — F17200 Nicotine dependence, unspecified, uncomplicated: Secondary | ICD-10-CM | POA: Diagnosis not present

## 2016-02-07 NOTE — Patient Instructions (Signed)
Medication Instructions:   No medication changes made  Labwork:  No new labs needed  Testing/Procedures:  We will order a 48 hour holter monitor for PVCs, cardiomyopathy We will call you with the results If you have lots of PVCs, we might increase the coreg   I recommend watching educational videos on topics of interest to you at:       [email or web address].goemmi.com  Enter code: HEARTCARE    Follow-Up: It was a pleasure seeing you in the office today. Please call us if you have new issues that need to be addressed before your next appt.  [phone]  Your physician wants you to follow-up in: 6 months.  You will receive a reminder letter in the mail two months in advance. If you don't receive a letter, please call our office to schedule the follow-up appointment.  If you need a refill on your cardiac medications before your next appointment, please call your pharmacy.

## 2016-02-07 NOTE — Progress Notes (Signed)
Cardiology Office Note  Date:  02/07/2016   ID:  Charlene Schmitt, DOB 07-May-1955, MRN [id number]  PCP:  Dione Housekeeper, MD   Chief Complaint  Patient presents with  . other    6 month f/u no complaints today. Meds reviewed verbally with pt.    HPI:  Charlene Schmitt is a pleasant 61 year old woman with history of obesity, hypertension, history of nonischemic cardiomyopathy, ejection fraction less than 25%, cardiac catheterization showing no significant coronary artery disease, normal right ventricular systolic pressures on echocardiogram. She presents for routine follow up for her cardiomyopathy.  In follow-up today she reports that she is doing well She is getting over Sinus congestion, post nasal drip,Cough, Continues to Smoke No regular exercise program but she does walk miles a day at work Works at [employer]  Having problems with discomfort in her leg. Was seen by orthopedics, x-rays showing L2-L5 significant disc disease  takes torsemide one half pill twice a day with potassium  EKG on today's visit shows normal sinus rhythm with rate 80 bpm, T-wave abnormality through theanterior precordial leads, frequent PVCs  Other past medical history  Previous renal dysfunction in the setting of NSAIDs and diuresis renal ultrasound 05/29/2011 showing no hydronephrosis  seen by renal service at [facility]  Total cholesterol now 200    PMH:   has a past medical history of Acute systolic heart failure (HCC); CHF (congestive heart failure) (HCC); COPD (chronic obstructive pulmonary disease) (HCC); History of recurrent miscarriages, not currently pregnant; Hypertension; and Nonischemic cardiomyopathy (HCC).  PSH:    Past Surgical History:  Procedure Laterality Date  . CARDIAC CATHETERIZATION  11/19/2011  . GANGLION CYST EXCISION     right hand   . LACERATION REPAIR     finger; s/p stitches  . TOOTH EXTRACTION     x2    Current Outpatient Prescriptions  Medication Sig  Dispense Refill  . albuterol (PROVENTIL HFA;VENTOLIN HFA) 108 (90 BASE) MCG/ACT inhaler Inhale 2 puffs into the lungs every 6 (six) hours as needed. 1 Inhaler 6  . aspirin 81 MG tablet Take 81 mg by mouth daily.    . carvedilol (COREG) 6.25 MG tablet TAKE 1 TABLET (6.25 MG TOTAL) BY MOUTH 2 (TWO) TIMES DAILY WITH A MEAL. 180 tablet 3  . Fluticasone-Salmeterol (ADVAIR DISKUS) 250-50 MCG/DOSE AEPB Inhale 1 puff into the lungs as needed.     Marland Kitchen ibuprofen (ADVIL,MOTRIN) 200 MG tablet Take 200 mg by mouth every 6 (six) hours as needed.    . isosorbide mononitrate (IMDUR) 30 MG 24 hr tablet Take 30 mg by mouth daily.    Marland Kitchen losartan (COZAAR) 50 MG tablet TAKE 1 TABLET (50 MG TOTAL) BY MOUTH DAILY. 90 tablet 1  . montelukast (SINGULAIR) 10 MG tablet Take 10 mg by mouth at bedtime.    . potassium chloride SA (K-DUR,KLOR-CON) 20 MEQ tablet Take 20 mEq by mouth 2 (two) times daily.    Marland Kitchen torsemide (DEMADEX) 20 MG tablet Take 40 mg by mouth 2 (two) times daily.     No current facility-administered medications for this visit.      Allergies:   Ace inhibitors and Penicillins   Social History:  The patient  reports that she has been smoking Cigarettes.  She has a 20.00 pack-year smoking history. She has never used smokeless tobacco. She reports that she drinks alcohol. She reports that she does not use drugs.   Family History:   family history includes Hypertension in her father and mother.  Review of Systems: Review of Systems  Constitutional: Negative.   Respiratory: Negative.   Cardiovascular: Positive for palpitations.  Gastrointestinal: Negative.   Musculoskeletal: Negative.   Neurological: Negative.   Psychiatric/Behavioral: Negative.   All other systems reviewed and are negative.    PHYSICAL EXAM: VS:  BP 128/90 (BP Location: Left Arm, Patient Position: Sitting, Cuff Size: Normal)   Pulse 80   Ht 5' 6.5" (1.689 m)   Wt 224 lb 8 oz (101.8 kg)   BMI 35.69 kg/m  , BMI Body mass index is  35.69 kg/m. GEN: Well nourished, well developed, in no acute distress , obese HEENT: normal  Neck: no JVD, carotid bruits, or masses Cardiac: RRR; no murmurs, rubs, or gallops,no edema  Respiratory:  clear to auscultation bilaterally, normal work of breathing GI: soft, nontender, nondistended, + BS MS: no deformity or atrophy  Skin: warm and dry, no rash Neuro:  Strength and sensation are intact Psych: euthymic mood, full affect    Recent Labs: No results found for requested labs within last 8760 hours.    Lipid Panel Lab Results  Component Value Date   CHOL 172 11/18/2011   HDL 74 (H) 11/18/2011   LDLCALC 80 11/18/2011   TRIG 89 11/18/2011      Wt Readings from Last 3 Encounters:  02/07/16 224 lb 8 oz (101.8 kg)  03/09/14 228 lb 8 oz (103.6 kg)  06/16/13 240 lb 8 oz (109.1 kg)       ASSESSMENT AND PLAN:  Nonischemic cardiomyopathy (HCC) - Plan: EKG 12-Lead Various medication options discussed with her We will start with Holter monitor Depending on results from Holter, Consider echocardiogram to reevaluate ejection fraction Potentially could change the losartan to entresto if ejection fraction remains less than 40%  Shortness of breath Reports shortness of breath is mild, stable Feels she is keeping the fluid off with torsemide  Smoker We have encouraged her to continue to work on weaning her cigarettes and smoking cessation. She will continue to work on this and does not want any assistance with chantix.   Frequent PVCs Long discussion concerning her PVCs Appears to be asymptomatic though having frequent ectopy Holter monitor ordered to determine PVC burden If greater than 20,000, would consider repeating echocardiogram Would discuss with EP. Potentially could increase beta blocker, alternatively could add antiarrhythmics.    Total encounter time more than 25 minutes  Greater than 50% was spent in counseling and coordination of care with the  patient   Disposition:   F/U  6 months   Orders Placed This Encounter  Procedures  . Holter monitor - 48 hour  . EKG 12-Lead     Signed, Dossie Arbour, M.D., Ph.D. 02/07/2016  [facility] [place], [place] [phone]

## 2016-02-09 DIAGNOSIS — I428 Other cardiomyopathies: Secondary | ICD-10-CM | POA: Diagnosis not present

## 2016-02-09 DIAGNOSIS — I493 Ventricular premature depolarization: Secondary | ICD-10-CM | POA: Diagnosis not present

## 2016-02-09 DIAGNOSIS — R0602 Shortness of breath: Secondary | ICD-10-CM | POA: Diagnosis not present

## 2016-02-16 ENCOUNTER — Ambulatory Visit: Payer: Managed Care, Other (non HMO) | Attending: Cardiovascular Disease

## 2016-02-16 DIAGNOSIS — R9431 Abnormal electrocardiogram [ECG] [EKG]: Secondary | ICD-10-CM | POA: Diagnosis not present

## 2016-02-16 DIAGNOSIS — R0602 Shortness of breath: Secondary | ICD-10-CM | POA: Diagnosis present

## 2016-02-27 ENCOUNTER — Other Ambulatory Visit: Payer: Self-pay

## 2016-02-27 DIAGNOSIS — I493 Ventricular premature depolarization: Secondary | ICD-10-CM

## 2016-02-28 ENCOUNTER — Telehealth: Payer: Self-pay | Admitting: *Deleted

## 2016-02-28 ENCOUNTER — Encounter: Payer: Self-pay | Admitting: *Deleted

## 2016-02-28 NOTE — Telephone Encounter (Signed)
-----   Message from Antonieta Iba, MD sent at 02/26/2016  9:49 PM EST ----- holter shows surprising amount of ventricular ectopy, short runs of NSVT We need urgent echo and very close follow up with Dr. Graciela Husbands in clinic after echo to discussion concerning dilated nonischemic cardiomyopathy EF 25% in 2014 and PVCs/NSVT seen on holter Will cc Dr. Graciela Husbands. Perhaps he can look at holter thx TG

## 2016-02-28 NOTE — Telephone Encounter (Signed)
Reviewed results and recommendations with patient. Scheduled her to come in on Tuesday March 05, 2016 at 4:00PM for echocardiogram and then on March 14, 2016 at 10:00AM to see Dr. Graciela Husbands. She did request that I send her a letter with the date and times. Will send letter in the mail with this information. She verbalized understanding of our conversation, agreement with plan, and had no further questions at this time.

## 2016-02-29 ENCOUNTER — Telehealth: Payer: Self-pay | Admitting: Cardiovascular Disease

## 2016-02-29 NOTE — Telephone Encounter (Signed)
Spoke with patient to confirm appointments for next week because I had told her Tuesday on the 7th and it is actually Tuesday the 6th. Wanted to make sure that she knew which day to be here. During our conversation she reports that she is having problems with coughing at night and is unable to lay down for bed. She has to sit up and the coughing makes her short of breath. Let her know to seek treatment if she should develop any chills, fever, aches, or other symptoms. She denied those at this time. Let her know that we would see what the echocardiogram shows and if symptoms should worsen to give [place] a call back. She verbalized understanding and had no further questions at this time.

## 2016-03-01 NOTE — Telephone Encounter (Signed)
Cough may be sign of congestive heart failure Would increase torsemide up to full pill twice a day through the weekend Decreased fluid intake

## 2016-03-01 NOTE — Telephone Encounter (Signed)
Spoke w/ pt.  Advised her of Dr. Gollan's recommendation. She verbalizes understanding and is appreciative of the call.  

## 2016-03-05 ENCOUNTER — Ambulatory Visit (INDEPENDENT_AMBULATORY_CARE_PROVIDER_SITE_OTHER): Payer: Managed Care, Other (non HMO)

## 2016-03-05 ENCOUNTER — Other Ambulatory Visit: Payer: Self-pay

## 2016-03-05 DIAGNOSIS — I493 Ventricular premature depolarization: Secondary | ICD-10-CM

## 2016-03-05 NOTE — Telephone Encounter (Signed)
Dr. Graciela Husbands, did you look at this patient's holter?

## 2016-03-14 ENCOUNTER — Encounter: Payer: Self-pay | Admitting: Internal Medicine

## 2016-03-14 ENCOUNTER — Ambulatory Visit (INDEPENDENT_AMBULATORY_CARE_PROVIDER_SITE_OTHER): Payer: Managed Care, Other (non HMO) | Admitting: Internal Medicine

## 2016-03-14 VITALS — BP 140/80 | HR 88 | Ht 66.0 in | Wt 222.0 lb

## 2016-03-14 DIAGNOSIS — I493 Ventricular premature depolarization: Secondary | ICD-10-CM | POA: Diagnosis not present

## 2016-03-14 MED ORDER — AMIODARONE HCL 200 MG PO TABS: 200.0000 mg | ORAL_TABLET | Freq: Every day | ORAL | 6 refills | Status: DC

## 2016-03-14 MED ORDER — AMIODARONE HCL 200 MG PO TABS: ORAL_TABLET | ORAL | 0 refills | Status: DC

## 2016-03-14 MED ORDER — SPIRONOLACTONE 25 MG PO TABS: 25.0000 mg | ORAL_TABLET | Freq: Every day | ORAL | 6 refills | Status: DC

## 2016-03-14 NOTE — Progress Notes (Signed)
ELECTROPHYSIOLOGY CONSULT NOTE  Patient ID: Charlene Schmitt, MRN: [id number], DOB/AGE: 1955-11-21 61 y.o. Admit date: (Not on file) Date of Consult: 03/14/2016  Primary Physician: Dione Housekeeper, MD Primary Cardiologist: TG Consulting Physician TG  Chief Complaint: VENTRICULAR ECTOPY   HPI Charlene Schmitt is a 61 y.o. female  Referred for complex ventricular ectopy.  She has a long-standing history of cardiomyopathy. This dates back at least 5 years. She has chronic systolic heart failure which is relatively well compensated class IIb-IIIa. She has a history of hypertension. She has chronic 2-3 pillow orthopnea. She has occasional PND. She does not have peripheral edema nor abdominal swelling.  She has noted some mild changes in exercise tolerance over recent months. Basically however, things have been stable. She also has long-standing palpitations which have not changed.  Holter was reviewed. It demonstrated 22% PVCs with multiple morphologies and multiple runs  DATE TEST    10/13 CATH EF 15-20% NORMAL CAs  2/14    ECHO   EF 25-30 %   2/18 ECHO EF 25%    ECGs were reviewed 2014--16 without evidence of ventricular ectopy ECG 1/18 demonstrated PVCs with left bottles appear axis  Last laboratories are 11/17 with potassium that was normal. No magnesium was noted; last CBC 2015  Past Medical History:  Diagnosis Date  . Acute systolic heart failure (HCC)   . CHF (congestive heart failure) (HCC)   . COPD (chronic obstructive pulmonary disease) (HCC)   . 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 Hypertension   . Nonischemic cardiomyopathy [facility])       Surgical History:  Past Surgical History:  Procedure Laterality Date  . CARDIAC CATHETERIZATION  11/19/2011  . GANGLION CYST EXCISION     right hand   . LACERATION REPAIR     finger; s/p stitches  . TOOTH EXTRACTION     x2     Home Meds: Prior to Admission medications     Medication Sig Start Date End Date Taking? Authorizing Provider  albuterol (PROVENTIL HFA;VENTOLIN HFA) 108 (90 BASE) MCG/ACT inhaler Inhale 2 puffs into the lungs every 6 (six) hours as needed. 12/15/12  Yes Antonieta Iba, MD  aspirin 81 MG tablet Take 81 mg by mouth daily.   Yes Historical Provider, MD  carvedilol (COREG) 6.25 MG tablet TAKE 1 TABLET (6.25 MG TOTAL) BY MOUTH 2 (TWO) TIMES DAILY WITH A MEAL. 12/01/12  Yes Antonieta Iba, MD  Fluticasone-Salmeterol (ADVAIR DISKUS) 250-50 MCG/DOSE AEPB Inhale 1 puff into the lungs as needed.    Yes Historical Provider, MD  ibuprofen (ADVIL,MOTRIN) 200 MG tablet Take 200 mg by mouth every 6 (six) hours as needed.   Yes Historical Provider, MD  isosorbide mononitrate (IMDUR) 30 MG 24 hr tablet Take 30 mg by mouth daily.   Yes Historical Provider, MD  losartan (COZAAR) 50 MG tablet TAKE 1 TABLET (50 MG TOTAL) BY MOUTH DAILY. 02/08/14  Yes Antonieta Iba, MD  montelukast (SINGULAIR) 10 MG tablet Take 10 mg by mouth at bedtime.   Yes Historical Provider, MD  potassium chloride SA (K-DUR,KLOR-CON) 20 MEQ tablet Take 20 mEq by mouth 2 (two) times daily.   Yes Historical Provider, MD  torsemide (DEMADEX) 20 MG tablet Take 40 mg by mouth 2 (two) times daily. 12/15/12  Yes Antonieta Iba, MD    Allergies:  Allergies  Allergen Reactions  . Ace Inhibitors Other (See Comments)    Cough Cough  .  Penicillins Rash    Social History   Social History  . Marital status: Single    Spouse name: N/A  . Number of children: N/A  . Years of education: N/A   Occupational History  . Not on file.   Social History Main Topics  . Smoking status: Current Every Day Smoker    Packs/day: 0.50    Years: 40.00    Types: Cigarettes  . Smokeless tobacco: Never Used  . Alcohol use Yes     Comment: occas. social drinker  . Drug use: No  . Sexual activity: Not on file   Other Topics Concern  . Not on file   Social History Narrative  . No narrative on  file     Family History  Problem Relation Age of Onset  . Hypertension Mother   . Hypertension Father      ROS:  Please see the history of present illness.     All other systems reviewed and negative.    Physical Exam:  Blood pressure 140/80, pulse 88, height 5\' 6"  (1.676 m), weight 222 lb (100.7 kg). General: Well developed, well nourished female in no acute distress. Head: Normocephalic, atraumatic, sclera non-icteric, no xanthomas, nares are without discharge. EENT: normal  Lymph Nodes:  none Neck: Negative for carotid bruits. JVD 8 Back:without scoliosis kyphosis Lungs: Clear bilaterally to auscultation without wheezes, rales, or rhonchi. Breathing is unlabored. Heart: Irregular rate and rhythm with S1 S2. No murmur . No rubs, or gallops appreciated. Abdomen: Soft, non-tender, non-distended with normoactive bowel sounds. No hepatomegaly. No rebound/guarding. No obvious abdominal masses. Msk:  Strength and tone appear normal for age. Extremities: No clubbing or cyanosis. No edema.  Distal pedal pulses are 2+ and equal bilaterally. Skin: Warm and Dry Neuro: Alert and oriented X 3. CN III-XII intact Grossly normal sensory and motor function . Psych:  Responds to questions appropriately with a normal affect.      Labs: Cardiac Enzymes No results for input(s): CKTOTAL, CKMB, TROPONINI in the last 72 hours. CBC Lab Results  Component Value Date   WBC 5.0 11/17/2011   HGB 14.1 11/17/2011   HCT 42.6 11/17/2011   MCV 89 11/17/2011   PLT 265 11/17/2011   PROTIME: No results for input(s): LABPROT, INR in the last 72 hours. Chemistry No results for input(s): NA, K, CL, CO2, BUN, CREATININE, CALCIUM, PROT, BILITOT, ALKPHOS, ALT, AST, GLUCOSE in the last 168 hours.  Invalid input(s): LABALBU Lipids Lab Results  Component Value Date   CHOL 172 11/18/2011   HDL 74 (H) 11/18/2011   LDLCALC 80 11/18/2011   TRIG 89 11/18/2011   BNP No results found for: PROBNP Thyroid  Function Tests: No results for input(s): TSH, T4TOTAL, T3FREE, THYROIDAB in the last 72 hours.  Invalid input(s): FREET3 Miscellaneous No results found for: DDIMER  Radiology/Studies:  No results found.  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 It is not likely that the PVCs the cause of the cardiomyopathy although her cardiomyopathy is certainly at risk for worsening with the frequent ventricular ectopy. A nonischemic patient's the ventricular ectopy is associated with an overall higher risk of death but not particularly sudden death; still however, she is at risk given her  nonischemic cardiomyopathy and might benefit from defibrillator implantation.  However, there some medication options that we have that may improve the situation. First, we will discontinue her nitrates and begin her on spironolactone. While she does have a history of renal insufficiency with a creatinine of 2.3 a couple of years ago 11/17 it was 1.2. She will need close follow-up.  Following initiation of Aldactone, we will have her see one of the PAs and invite initiation of Entresto for its augmentation of benefit. We will also begin her on amiodarone in an effort to suppress her ventricular ectopy. The plan would be to reassess PVC burden in about 8 weeks or so and then if we have significant reduction reassess LV function about a week after that. At that juncture would be reasonable to consider ICD implantation if the LVEF is unchanged. If it is improving, we would continue to observe.  We have discussed amiodarone side effects.  We will check blood work. There is no CBC in the  last couple of years. We'll check a magnesium given the ventricular ectopy. If her renal function is okay will actually add by mouth magnesium at her follow-up visit.    Sherryl Manges

## 2016-03-14 NOTE — Patient Instructions (Addendum)
Medication Instructions: - Your physician has recommended you make the following change in your medication:  1) Stop imdur (isosorbide) 2) Start aldactone (spironolactone) 25 mg- take one tablet by mouth once daily 3) Start amiodarone 200 mg     - take two tablets (400 mg) by mouth twice daily x 2 weeks, then     - take two tablets (400 mg) by mouth once daily x 2 weeks, then     - take one tablet (200 mg) by mouth once daily  Labwork: - Your physician recommends that you have lab work today: CMET/ TSH/ Magnesium/ CBC  Procedures/Testing: - none ordered  Follow-Up: - Your physician recommends that you schedule a follow-up appointment in: 2 weeks with the PA/ NP  - Your physician recommends that you schedule a follow-up appointment in: 8 weeks with Dr. Graciela Husbands.   Any Additional Special Instructions Will Be Listed Below (If Applicable).     If you need a refill on your cardiac medications before your next appointment, please call your pharmacy.

## 2016-03-15 ENCOUNTER — Other Ambulatory Visit: Payer: Managed Care, Other (non HMO)

## 2016-03-15 LAB — CBC WITH DIFFERENTIAL/PLATELET
BASOS: 1 %
Basophils Absolute: 0 10*3/uL (ref 0.0–0.2)
EOS (ABSOLUTE): 0.2 10*3/uL (ref 0.0–0.4)
EOS: 6 %
HEMATOCRIT: 41.3 % (ref 34.0–46.6)
HEMOGLOBIN: 13.4 g/dL (ref 11.1–15.9)
Immature Grans (Abs): 0 10*3/uL (ref 0.0–0.1)
Immature Granulocytes: 0 %
Lymphocytes Absolute: 1.5 10*3/uL (ref 0.7–3.1)
Lymphs: 38 %
MCH: 29.1 pg (ref 26.6–33.0)
MCHC: 32.4 g/dL (ref 31.5–35.7)
MCV: 90 fL (ref 79–97)
MONOCYTES: 13 %
Monocytes Absolute: 0.5 10*3/uL (ref 0.1–0.9)
NEUTROS ABS: 1.6 10*3/uL (ref 1.4–7.0)
Neutrophils: 42 %
Platelets: 203 10*3/uL (ref 150–379)
RBC: 4.61 x10E6/uL (ref 3.77–5.28)
RDW: 15.5 % — ABNORMAL HIGH (ref 12.3–15.4)
WBC: 3.8 10*3/uL (ref 3.4–10.8)

## 2016-03-15 LAB — MAGNESIUM: Magnesium: 2 mg/dL (ref 1.6–2.3)

## 2016-03-15 LAB — COMPREHENSIVE METABOLIC PANEL
ALBUMIN: 3.6 g/dL (ref 3.6–4.8)
ALT: 21 IU/L (ref 0–32)
AST: 17 IU/L (ref 0–40)
Albumin/Globulin Ratio: 1.6 (ref 1.2–2.2)
Alkaline Phosphatase: 66 IU/L (ref 39–117)
BUN / CREAT RATIO: 17 (ref 12–28)
BUN: 18 mg/dL (ref 8–27)
Bilirubin Total: 0.7 mg/dL (ref 0.0–1.2)
CHLORIDE: 109 mmol/L — AB (ref 96–106)
CO2: 21 mmol/L (ref 18–29)
Calcium: 9.6 mg/dL (ref 8.7–10.3)
Creatinine, Ser: 1.09 mg/dL — ABNORMAL HIGH (ref 0.57–1.00)
GFR calc non Af Amer: 55 mL/min/{1.73_m2} — ABNORMAL LOW (ref >59)
GFR, EST AFRICAN AMERICAN: 63 mL/min/{1.73_m2} (ref >59)
GLOBULIN, TOTAL: 2.3 g/dL (ref 1.5–4.5)
GLUCOSE: 96 mg/dL (ref 65–99)
Potassium: 4.8 mmol/L (ref 3.5–5.2)
SODIUM: 144 mmol/L (ref 134–144)
TOTAL PROTEIN: 5.9 g/dL — AB (ref 6.0–8.5)

## 2016-03-15 LAB — TSH: TSH: 0.762 u[IU]/mL (ref 0.450–4.500)

## 2016-03-29 ENCOUNTER — Ambulatory Visit (INDEPENDENT_AMBULATORY_CARE_PROVIDER_SITE_OTHER): Payer: Managed Care, Other (non HMO) | Admitting: Nurse Practitioner

## 2016-03-29 ENCOUNTER — Encounter: Payer: Self-pay | Admitting: Nurse Practitioner

## 2016-03-29 VITALS — BP 132/88 | HR 62 | Ht 66.0 in | Wt 220.5 lb

## 2016-03-29 DIAGNOSIS — I110 Hypertensive heart disease with heart failure: Secondary | ICD-10-CM

## 2016-03-29 DIAGNOSIS — I428 Other cardiomyopathies: Secondary | ICD-10-CM

## 2016-03-29 DIAGNOSIS — I5022 Chronic systolic (congestive) heart failure: Secondary | ICD-10-CM

## 2016-03-29 DIAGNOSIS — Z720 Tobacco use: Secondary | ICD-10-CM

## 2016-03-29 NOTE — Patient Instructions (Addendum)
Medication Instructions:  No changes. Continue current medications.  Labwork: We will call you with those results.   Testing/Procedures: Your physician has requested that you have an echocardiogram. Echocardiography is a painless test that uses sound waves to create images of your heart. It provides your doctor with information about the size and shape of your heart and how well your heart's chambers and valves are working. This procedure takes approximately one hour. There are no restrictions for this procedure.  Please get this scheduled for 05/08/16   Follow-Up: Keep follow up appointment with Dr. Graciela Husbands 05/09/16 at 09:45AM  It was a pleasure seeing you today here in the office. Please do not hesitate to give [place] a call back if you have any further questions. [phone]  Waubun Cellar RN, BSN

## 2016-03-29 NOTE — Progress Notes (Signed)
Office Visit    Patient Name: Charlene Schmitt Date of Encounter: 03/29/2016  Primary Care Provider:  Dione Housekeeper, MD Primary Cardiologist:  Concha Se, MD / S. Graciela Husbands, MD   Chief Complaint    61 year old femaleWith a history of nonischemic cardiomyopathy, hypertension, tobacco abuse, obesity, and PVCs, who presents for office follow-up.  Past Medical History    Past Medical History:  Diagnosis Date  . Chronic systolic CHF (congestive heart failure) (HCC)    a. 10/2011 Cath: nl cors, EF 15-20%;  b. 02/2012 Echo: EF 25-30%; c. 02/2016 Echo: EF 20-25%, diff HK, mild MR, mildly dil LA.  Marland Kitchen COPD (chronic obstructive pulmonary disease) (HCC)   . 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 Hypertension   . Morbid obesity (HCC)   . NICM (nonischemic cardiomyopathy) (HCC)    a. 10/2011 Cath: nl cors, EF 15-20%;  b. 02/2012 Echo: EF 25-30%; c. 02/2016 Echo: EF 20-25%.  . PVC's (premature ventricular contractions)    a. 01/2016 Holter: 22% PVC's w/ multiple morphologies with runs.  . Tobacco abuse    Past Surgical History:  Procedure Laterality Date  . CARDIAC CATHETERIZATION  11/19/2011  . GANGLION CYST EXCISION     right hand   . LACERATION REPAIR     finger; s/p stitches  . TOOTH EXTRACTION     x2    Allergies  Allergies  Allergen Reactions  . Ace Inhibitors Other (See Comments)    Cough Cough  . Penicillins Rash    History of Present Illness    61 year old female with the above, plus past medical history including nonischemic cardiomyopathy with normal coronary arteries by catheterization in 2013, chronic systolic congestive heart failure (EF 20-25%), hypertension, obesity, tobacco abuse, and COPD. She recently wore a Holter monitor in January 2018 in the setting of frequent PVCs. This showed PVCs accounting for 22% of her rhythm. She was subsequently seen by electrophysiology and initiated on amiodarone therapy. Spironolactone was also started.  Over the past 2 weeks, she has noticed a reduction in frequency of palpitations. She denies chest pain, dyspnea, PND, orthopnea, dizziness, syncope, edema, or early satiety. She has been tolerating both amiodarone and spironolactone well up to this point. We did discuss possibly switching her from losartan to entresto, however she is not interested in making the switch at this time.  Home Medications    Prior to Admission medications   Medication Sig Start Date End Date Taking? Authorizing Provider  albuterol (PROVENTIL HFA;VENTOLIN HFA) 108 (90 BASE) MCG/ACT inhaler Inhale 2 puffs into the lungs every 6 (six) hours as needed. 12/15/12  Yes Antonieta Iba, MD  amiodarone (PACERONE) 200 MG tablet Take two tablets (400 mg) by mouth twice daily x 2 weeks, then take two tablets (400 mg) by mouth once daily 03/14/16  Yes Duke Salvia, MD  amiodarone (PACERONE) 200 MG tablet Take 1 tablet (200 mg total) by mouth daily. 03/14/16  Yes Duke Salvia, MD  aspirin 81 MG tablet Take 81 mg by mouth daily.   Yes Historical Provider, MD  carvedilol (COREG) 6.25 MG tablet TAKE 1 TABLET (6.25 MG TOTAL) BY MOUTH 2 (TWO) TIMES DAILY WITH A MEAL. 12/01/12  Yes Antonieta Iba, MD  Fluticasone-Salmeterol (ADVAIR DISKUS) 250-50 MCG/DOSE AEPB Inhale 1 puff into the lungs as needed.    Yes Historical Provider, MD  ibuprofen (ADVIL,MOTRIN) 200 MG tablet Take 200 mg by mouth every 6 (six) hours as needed.   Yes  Historical Provider, MD  losartan (COZAAR) 50 MG tablet TAKE 1 TABLET (50 MG TOTAL) BY MOUTH DAILY. 02/08/14  Yes Antonieta Iba, MD  montelukast (SINGULAIR) 10 MG tablet Take 10 mg by mouth at bedtime.   Yes Historical Provider, MD  potassium chloride SA (K-DUR,KLOR-CON) 20 MEQ tablet Take 20 mEq by mouth 2 (two) times daily.   Yes Historical Provider, MD  spironolactone (ALDACTONE) 25 MG tablet Take 1 tablet (25 mg total) by mouth daily. 03/14/16 06/12/16 Yes Duke Salvia, MD  torsemide (DEMADEX) 20 MG tablet  Take 40 mg by mouth 2 (two) times daily. 12/15/12  Yes Antonieta Iba, MD    Review of Systems    As above, doing well without chest pain, dyspnea, PND, orthopnea, dizziness, syncope, edema, or early satiety she does have some palpitations though overall, burden is reduced.  All other systems reviewed and are otherwise negative except as noted above.  Physical Exam    VS:  BP 132/88 (BP Location: Left Arm, Patient Position: Sitting, Cuff Size: Normal)   Pulse 62   Ht 5\' 6"  (1.676 m)   Wt 220 lb 8 oz (100 kg)   BMI 35.59 kg/m  , BMI Body mass index is 35.59 kg/m. GEN: Morbidly obese, in no acute distress.  HEENT: normal.  Neck: Supple, no JVD, carotid bruits, or masses. Cardiac: RRR, distant heart sounds, no rubs, or gallops. No clubbing, cyanosis, edema.  Radials/DP/PT 2+ and equal bilaterally.  Respiratory:  Respirations regular and unlabored, diminished breath sounds bilaterally with faint expiratory wheezing noted. GI: Obese, Soft, nontender, nondistended, BS + x 4. MS: no deformity or atrophy. Skin: warm and dry, no rash. Neuro:  Strength and sensation are intact. Psych: Normal affect.  Accessory Clinical Findings   EKG and previous labs personally reviewed in detail.  ECG - Regular sinus rhythm, 62, leftward axis, LVH with repolarization abnormalities-no acute changes.  Lab Results  Component Value Date   CREATININE 1.09 (H) 03/14/2016   BUN 18 03/14/2016   NA 144 03/14/2016   K 4.8 03/14/2016   CL 109 (H) 03/14/2016   CO2 21 03/14/2016   Lab Results  Component Value Date   WBC 3.8 03/14/2016   HGB 14.1 11/17/2011   HCT 41.3 03/14/2016   MCV 90 03/14/2016   PLT 203 03/14/2016   Lab Results  Component Value Date   TSH 0.762 03/14/2016    Assessment & Plan    1.  Nonischemic cardiomyopathy/chronic systolic congestive heart failure: Recent echo in February showed an EF of 20-25%. She has been on beta blocker and ARB therapy long-term and 2 weeks ago she was  placed on spironolactone. She has been tolerating this well. She weighs herself at home and notes that her weight has been stable. Her weight is down slightly since her last visit here in clinic. I will arrange for a basic metabolic panel today to reevaluate renal function and potassium. We did discuss switching from losartan to entrust, however in the setting of recent medication changes, she prefers to hold off for right now. She says that sometimes she feels like she is taking too much medicine and doesn't want to make too many changes at once. She is willing to readdress this at her follow-up visit in April.  We discussed the importance of daily weights, sodium restriction, medication compliance, and symptom reporting and she verbalizes understanding.   2. Symptomatic PVCs: Patient was recently found on Holter monitoring to have multifocal PVCs accounting for  22% of her rhythm. She has been seen by Dr. Graciela Husbands and placed on amiodarone on February 15. She has had some reduction in palpitations since its initiation. She is due to reduce her dose to 400 mg daily for 2 weeks beginning tomorrow. The plan is for follow-up echo after 8 weeks of amiodarone therapy. I will arrange for this on April 11 and then she will see Dr. Graciela Husbands on April 12. If EF remains down despite better control PVCs, consideration will be given to ICD therapy.  3. Hypertensive heart disease with heart failure: Blood pressure is stable today.  4. Tobacco abuse/COPD: She has faint expiratory wheezing on exam. Smoking cessation counseling provided. She is not currently interested in quitting.  5. Disposition: Follow-up basic metabolic panel today. Follow-up echo just prior to visit with Dr. Graciela Husbands in April.   Nicolasa Ducking, NP 03/29/2016, 2:43 PM

## 2016-03-30 LAB — BASIC METABOLIC PANEL
BUN/Creatinine Ratio: 17 (ref 12–28)
BUN: 20 mg/dL (ref 8–27)
CALCIUM: 9.8 mg/dL (ref 8.7–10.3)
CHLORIDE: 106 mmol/L (ref 96–106)
CO2: 24 mmol/L (ref 18–29)
CREATININE: 1.15 mg/dL — AB (ref 0.57–1.00)
GFR, EST AFRICAN AMERICAN: 59 mL/min/{1.73_m2} — AB (ref >59)
GFR, EST NON AFRICAN AMERICAN: 51 mL/min/{1.73_m2} — AB (ref >59)
Glucose: 104 mg/dL — ABNORMAL HIGH (ref 65–99)
Potassium: 4.6 mmol/L (ref 3.5–5.2)
Sodium: 143 mmol/L (ref 134–144)

## 2016-05-08 ENCOUNTER — Other Ambulatory Visit: Payer: Self-pay

## 2016-05-08 ENCOUNTER — Ambulatory Visit (INDEPENDENT_AMBULATORY_CARE_PROVIDER_SITE_OTHER): Payer: Managed Care, Other (non HMO)

## 2016-05-08 DIAGNOSIS — I428 Other cardiomyopathies: Secondary | ICD-10-CM | POA: Diagnosis not present

## 2016-05-09 ENCOUNTER — Encounter: Payer: Self-pay | Admitting: *Deleted

## 2016-05-09 ENCOUNTER — Ambulatory Visit (INDEPENDENT_AMBULATORY_CARE_PROVIDER_SITE_OTHER): Payer: Managed Care, Other (non HMO) | Admitting: Internal Medicine

## 2016-05-09 ENCOUNTER — Encounter: Payer: Self-pay | Admitting: Internal Medicine

## 2016-05-09 VITALS — BP 116/80 | HR 69 | Ht 66.0 in | Wt 218.2 lb

## 2016-05-09 DIAGNOSIS — Z79899 Other long term (current) drug therapy: Secondary | ICD-10-CM

## 2016-05-09 DIAGNOSIS — I428 Other cardiomyopathies: Secondary | ICD-10-CM

## 2016-05-09 DIAGNOSIS — Z01812 Encounter for preprocedural laboratory examination: Secondary | ICD-10-CM

## 2016-05-09 DIAGNOSIS — I493 Ventricular premature depolarization: Secondary | ICD-10-CM | POA: Diagnosis not present

## 2016-05-09 DIAGNOSIS — I5022 Chronic systolic (congestive) heart failure: Secondary | ICD-10-CM

## 2016-05-09 NOTE — Patient Instructions (Addendum)
Medication Instructions: - Your physician recommends that you continue on your current medications as directed. Please refer to the Current Medication list given to you today.  Labwork: - Your physician recommends that you return for lab work: the week of 06/10/16- CMET/ CBC/ INR./TSH  Procedures/Testing: - Your physician has recommended that you have a defibrillator inserted. An implantable cardioverter defibrillator (ICD) is a small device that is placed in your chest or, in rare cases, your abdomen. This device uses electrical pulses or shocks to help control life-threatening, irregular heartbeats that could lead the heart to suddenly stop beating (sudden cardiac arrest). Leads are attached to the ICD that goes into your heart. This is done in the hospital and usually requires an overnight stay. Please see the instruction sheet given to you today for more information.  Follow-Up: - Your physician recommends that you schedule a follow-up appointment in: about 14 days (from 06/19/16) for a wound check with the [facility].  - Your physician recommends that you schedule a follow-up appointment in: 3 months (at least 91 days) from 5/23 with Dr. Graciela Husbands.     Any Additional Special Instructions Will Be Listed Below (If Applicable).     If you need a refill on your cardiac medications before your next appointment, please call your pharmacy.

## 2016-05-09 NOTE — Progress Notes (Signed)
Patient Care Team: Dione Housekeeper, MD as PCP - General   HPI  Charlene Schmitt is a 61 y.o. female Seen in follow-up for complex ventricular ectopy in the setting of a persistent nonischemic cardiomyopathy and modest congestive heart failure. In was our impression that the myopathy preceded the ectopy. Hence, our initial effort was to treat both her cardiomyopathy with aldactone +/- entresto and  her PVC burden with amiodarone  1/18 >> Holter  22% PVCs with multiple morphologies and multiple runs  DATE TEST    10/13 CATH EF 15-20% NORMAL CAs  2/14 ECHO   EF 25-30 %   2/18 ECHO EF 25%   4/18 Echo EF 25-30%         ECGs were reviewed 2014--16 without evidence of ventricular ectopy ECG 1/18 demonstrated PVCs with left BBB superior axis  When she saw CB 3/18 her palpitations were  much better.  She Continues to have few palpitations. Functional status is stable with mild dyspnea on exertion. She has no chest pain or peripheral edema.  Past Medical History:  Diagnosis Date  . Chronic systolic CHF (congestive heart failure) (HCC)    a. 10/2011 Cath: nl cors, EF 15-20%;  b. 02/2012 Echo: EF 25-30%; c. 02/2016 Echo: EF 20-25%, diff HK, mild MR, mildly dil LA.  Marland Kitchen COPD (chronic obstructive pulmonary disease) (HCC)   . 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 Hypertension   . Morbid obesity (HCC)   . NICM (nonischemic cardiomyopathy) (HCC)    a. 10/2011 Cath: nl cors, EF 15-20%;  b. 02/2012 Echo: EF 25-30%; c. 02/2016 Echo: EF 20-25%.  . PVC's (premature ventricular contractions)    a. 01/2016 Holter: 22% PVC's w/ multiple morphologies with runs.  . Tobacco abuse     Past Surgical History:  Procedure Laterality Date  . CARDIAC CATHETERIZATION  11/19/2011  . GANGLION CYST EXCISION     right hand   . LACERATION REPAIR     finger; s/p stitches  . TOOTH EXTRACTION     x2    Current Outpatient Prescriptions  Medication Sig Dispense Refill    . albuterol (PROVENTIL HFA;VENTOLIN HFA) 108 (90 BASE) MCG/ACT inhaler Inhale 2 puffs into the lungs every 6 (six) hours as needed. 1 Inhaler 6  . amiodarone (PACERONE) 200 MG tablet Take 1 tablet (200 mg total) by mouth daily. 30 tablet 6  . aspirin 81 MG tablet Take 81 mg by mouth daily.    . carvedilol (COREG) 6.25 MG tablet TAKE 1 TABLET (6.25 MG TOTAL) BY MOUTH 2 (TWO) TIMES DAILY WITH A MEAL. 180 tablet 3  . Fluticasone-Salmeterol (ADVAIR DISKUS) 250-50 MCG/DOSE AEPB Inhale 1 puff into the lungs as needed.     Marland Kitchen ibuprofen (ADVIL,MOTRIN) 200 MG tablet Take 200 mg by mouth every 6 (six) hours as needed.    Marland Kitchen losartan (COZAAR) 50 MG tablet TAKE 1 TABLET (50 MG TOTAL) BY MOUTH DAILY. 90 tablet 1  . montelukast (SINGULAIR) 10 MG tablet Take 10 mg by mouth at bedtime.    . potassium chloride SA (K-DUR,KLOR-CON) 20 MEQ tablet Take 20 mEq by mouth 2 (two) times daily.    Marland Kitchen spironolactone (ALDACTONE) 25 MG tablet Take 1 tablet (25 mg total) by mouth daily. 30 tablet 6  . torsemide (DEMADEX) 20 MG tablet Take 40 mg by mouth 2 (two) times daily.     No current facility-administered medications for this visit.     Allergies  Allergen  Reactions  . Ace Inhibitors Other (See Comments)    Cough Cough  . Penicillins Rash      Review of Systems negative except from HPI and PMH  Physical Exam BP 116/80 (BP Location: Left Arm, Patient Position: Sitting, Cuff Size: Large)   Pulse 69   Ht 5\' 6"  (1.676 m)   Wt 218 lb 4 oz (99 kg)   BMI 35.23 kg/m  Well developed and well nourished in no acute distress HENT normal E scleral and icterus clear Neck Supple JVP flat; carotids brisk and full Clear to ausculation Regular rate and rhythm, no murmurs gallops or rub Soft with active bowel sounds No clubbing cyanosis  Edema Alert and oriented, grossly normal motor and sensory function Skin Warm and Dry  ECG demonstrates sinus rhythm at 64 Intervals 20/11/41 LVH with repolarization  abnormalities Personally reviewed    Assessment and  Plan   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 Potassium was stable at 4.6 and creatinine of 1.15.  Blood pressure is well-controlled.  PVCs are largely nonexistent; she is tolerating her amiodarone well. We will plan surveillance laboratories.  We had a lengthy discussion regarding medical therapy versus ICD therapy. She is not optimistic about the potential benefits of Entresto in improving her LV ejection fraction and hence would like to proceed with ICD implantation first. I think that that is reasonable.  Have reviewed the potential benefits and risks of ICD implantation including but not limited to death, perforation of heart or lung, lead dislodgement, infection,  device malfunction and inappropriate shocks.  The patient express understanding  and are willing to proceed.    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  The patient does not  have concerns regarding medicines.

## 2016-05-10 NOTE — Addendum Note (Signed)
Addended by: Juleen Starr on: 05/10/2016 02:25 PM   Modules accepted: Orders

## 2016-06-10 ENCOUNTER — Other Ambulatory Visit (INDEPENDENT_AMBULATORY_CARE_PROVIDER_SITE_OTHER): Payer: Managed Care, Other (non HMO)

## 2016-06-10 ENCOUNTER — Telehealth: Payer: Self-pay | Admitting: Internal Medicine

## 2016-06-10 DIAGNOSIS — I5022 Chronic systolic (congestive) heart failure: Secondary | ICD-10-CM

## 2016-06-10 DIAGNOSIS — I428 Other cardiomyopathies: Secondary | ICD-10-CM

## 2016-06-10 DIAGNOSIS — Z01812 Encounter for preprocedural laboratory examination: Secondary | ICD-10-CM

## 2016-06-10 NOTE — Telephone Encounter (Signed)
Pt dropped off paper work for [employer]  Received records request from [facility] , forwarded to [facility] for processing.  She needs this done asap

## 2016-06-11 LAB — CBC WITH DIFFERENTIAL/PLATELET
BASOS: 1 %
Basophils Absolute: 0 10*3/uL (ref 0.0–0.2)
EOS (ABSOLUTE): 0.2 10*3/uL (ref 0.0–0.4)
EOS: 6 %
HEMATOCRIT: 40.4 % (ref 34.0–46.6)
HEMOGLOBIN: 13 g/dL (ref 11.1–15.9)
IMMATURE GRANS (ABS): 0 10*3/uL (ref 0.0–0.1)
IMMATURE GRANULOCYTES: 0 %
LYMPHS: 39 %
Lymphocytes Absolute: 1.4 10*3/uL (ref 0.7–3.1)
MCH: 29.7 pg (ref 26.6–33.0)
MCHC: 32.2 g/dL (ref 31.5–35.7)
MCV: 92 fL (ref 79–97)
MONOCYTES: 10 %
Monocytes Absolute: 0.3 10*3/uL (ref 0.1–0.9)
NEUTROS ABS: 1.5 10*3/uL (ref 1.4–7.0)
Neutrophils: 44 %
Platelets: 246 10*3/uL (ref 150–379)
RBC: 4.37 x10E6/uL (ref 3.77–5.28)
RDW: 16.3 % — ABNORMAL HIGH (ref 12.3–15.4)
WBC: 3.4 10*3/uL (ref 3.4–10.8)

## 2016-06-11 LAB — COMPREHENSIVE METABOLIC PANEL
ALT: 13 IU/L (ref 0–32)
AST: 16 IU/L (ref 0–40)
Albumin/Globulin Ratio: 1.4 (ref 1.2–2.2)
Albumin: 3.9 g/dL (ref 3.6–4.8)
Alkaline Phosphatase: 68 IU/L (ref 39–117)
BILIRUBIN TOTAL: 0.3 mg/dL (ref 0.0–1.2)
BUN / CREAT RATIO: 17 (ref 12–28)
BUN: 23 mg/dL (ref 8–27)
CALCIUM: 9.6 mg/dL (ref 8.7–10.3)
CHLORIDE: 103 mmol/L (ref 96–106)
CO2: 20 mmol/L (ref 18–29)
CREATININE: 1.34 mg/dL — AB (ref 0.57–1.00)
GFR, EST AFRICAN AMERICAN: 49 mL/min/{1.73_m2} — AB (ref >59)
GFR, EST NON AFRICAN AMERICAN: 42 mL/min/{1.73_m2} — AB (ref >59)
GLUCOSE: 122 mg/dL — AB (ref 65–99)
Globulin, Total: 2.7 g/dL (ref 1.5–4.5)
Potassium: 4.3 mmol/L (ref 3.5–5.2)
Sodium: 142 mmol/L (ref 134–144)
TOTAL PROTEIN: 6.6 g/dL (ref 6.0–8.5)

## 2016-06-11 LAB — TSH: TSH: 0.659 u[IU]/mL (ref 0.450–4.500)

## 2016-06-11 LAB — PROTIME-INR
INR: 1 (ref 0.8–1.2)
Prothrombin Time: 11 s (ref 9.1–12.0)

## 2016-06-19 ENCOUNTER — Encounter (HOSPITAL_COMMUNITY): Payer: Self-pay | Admitting: Internal Medicine

## 2016-06-19 ENCOUNTER — Encounter (HOSPITAL_COMMUNITY)
Admission: RE | Disposition: A | Discharge status: Home / Self Care | Payer: Self-pay | Source: Ambulatory Visit | Attending: Internal Medicine

## 2016-06-19 ENCOUNTER — Ambulatory Visit (HOSPITAL_COMMUNITY)
Admission: RE | Admit: 2016-06-19 | Discharge: 2016-06-20 | Disposition: A | Discharge status: Home / Self Care | Payer: Managed Care, Other (non HMO) | Source: Ambulatory Visit | Attending: Internal Medicine | Admitting: Internal Medicine

## 2016-06-19 DIAGNOSIS — I472 Ventricular tachycardia: Secondary | ICD-10-CM | POA: Insufficient documentation

## 2016-06-19 DIAGNOSIS — I110 Hypertensive heart disease with heart failure: Secondary | ICD-10-CM | POA: Diagnosis not present

## 2016-06-19 DIAGNOSIS — I447 Left bundle-branch block, unspecified: Secondary | ICD-10-CM | POA: Diagnosis not present

## 2016-06-19 DIAGNOSIS — I428 Other cardiomyopathies: Secondary | ICD-10-CM | POA: Diagnosis not present

## 2016-06-19 DIAGNOSIS — I5022 Chronic systolic (congestive) heart failure: Secondary | ICD-10-CM | POA: Insufficient documentation

## 2016-06-19 DIAGNOSIS — Z7951 Long term (current) use of inhaled steroids: Secondary | ICD-10-CM | POA: Diagnosis not present

## 2016-06-19 DIAGNOSIS — I429 Cardiomyopathy, unspecified: Secondary | ICD-10-CM

## 2016-06-19 DIAGNOSIS — F17210 Nicotine dependence, cigarettes, uncomplicated: Secondary | ICD-10-CM | POA: Insufficient documentation

## 2016-06-19 DIAGNOSIS — I493 Ventricular premature depolarization: Secondary | ICD-10-CM | POA: Insufficient documentation

## 2016-06-19 DIAGNOSIS — Z959 Presence of cardiac and vascular implant and graft, unspecified: Secondary | ICD-10-CM

## 2016-06-19 DIAGNOSIS — J449 Chronic obstructive pulmonary disease, unspecified: Secondary | ICD-10-CM | POA: Diagnosis not present

## 2016-06-19 DIAGNOSIS — Z6835 Body mass index (BMI) 35.0-35.9, adult: Secondary | ICD-10-CM | POA: Diagnosis not present

## 2016-06-19 DIAGNOSIS — Z7982 Long term (current) use of aspirin: Secondary | ICD-10-CM | POA: Diagnosis not present

## 2016-06-19 DIAGNOSIS — Z880 Allergy status to penicillin: Secondary | ICD-10-CM | POA: Diagnosis not present

## 2016-06-19 HISTORY — DX: Chronic kidney disease, unspecified: N18.9

## 2016-06-19 HISTORY — DX: Unspecified osteoarthritis, unspecified site: M19.90

## 2016-06-19 HISTORY — PX: ICD IMPLANT: EP1208

## 2016-06-19 HISTORY — DX: Presence of automatic (implantable) cardiac defibrillator: Z95.810

## 2016-06-19 HISTORY — DX: Low back pain, unspecified: M54.50

## 2016-06-19 HISTORY — DX: Other chronic pain: G89.29

## 2016-06-19 HISTORY — DX: Low back pain: M54.5

## 2016-06-19 LAB — SURGICAL PCR SCREEN
MRSA, PCR: NEGATIVE
STAPHYLOCOCCUS AUREUS: NEGATIVE

## 2016-06-19 SURGERY — ICD IMPLANT

## 2016-06-19 MED ORDER — LIDOCAINE HCL (PF) 1 % IJ SOLN
INTRAMUSCULAR | Status: DC | PRN
  Administered 2016-06-19: 40 mL

## 2016-06-19 MED ORDER — POTASSIUM CHLORIDE CRYS ER 20 MEQ PO TBCR
20.0000 meq | EXTENDED_RELEASE_TABLET | Freq: Two times a day (BID) | ORAL | Status: DC
  Administered 2016-06-19 – 2016-06-20 (×2): 20 meq via ORAL
  Filled 2016-06-19 (×2): qty 1

## 2016-06-19 MED ORDER — AMIODARONE HCL 200 MG PO TABS
200.0000 mg | ORAL_TABLET | Freq: Every day | ORAL | Status: DC
  Administered 2016-06-20: 200 mg via ORAL
  Filled 2016-06-19: qty 1

## 2016-06-19 MED ORDER — VANCOMYCIN HCL IN DEXTROSE 1-5 GM/200ML-% IV SOLN
INTRAVENOUS | Status: AC
  Filled 2016-06-19: qty 200

## 2016-06-19 MED ORDER — SODIUM CHLORIDE 0.9 % IR SOLN
Status: AC
  Filled 2016-06-19: qty 2

## 2016-06-19 MED ORDER — LIDOCAINE HCL (PF) 1 % IJ SOLN
INTRAMUSCULAR | Status: AC
  Filled 2016-06-19: qty 30

## 2016-06-19 MED ORDER — SODIUM CHLORIDE 0.9 % IR SOLN
80.0000 mg | Status: AC
  Administered 2016-06-19: 80 mg
  Filled 2016-06-19: qty 2

## 2016-06-19 MED ORDER — VANCOMYCIN HCL IN DEXTROSE 1-5 GM/200ML-% IV SOLN
1000.0000 mg | Freq: Two times a day (BID) | INTRAVENOUS | Status: AC
  Administered 2016-06-19: 1000 mg via INTRAVENOUS
  Filled 2016-06-19: qty 200

## 2016-06-19 MED ORDER — ACETAMINOPHEN 325 MG PO TABS
325.0000 mg | ORAL_TABLET | ORAL | Status: DC | PRN
Start: 2016-06-19 — End: 2016-06-20
  Administered 2016-06-19: 650 mg via ORAL
  Filled 2016-06-19: qty 2

## 2016-06-19 MED ORDER — MUPIROCIN 2 % EX OINT
TOPICAL_OINTMENT | CUTANEOUS | Status: AC
  Administered 2016-06-19: 1 via TOPICAL
  Filled 2016-06-19: qty 22

## 2016-06-19 MED ORDER — HEPARIN (PORCINE) IN NACL 2-0.9 UNIT/ML-% IJ SOLN
INTRAMUSCULAR | Status: AC
  Filled 2016-06-19: qty 500

## 2016-06-19 MED ORDER — MIDAZOLAM HCL 5 MG/5ML IJ SOLN
INTRAMUSCULAR | Status: AC
  Filled 2016-06-19: qty 5

## 2016-06-19 MED ORDER — SODIUM CHLORIDE 0.9 % IV SOLN
INTRAVENOUS | Status: AC
  Administered 2016-06-19: 14:00:00 via INTRAVENOUS

## 2016-06-19 MED ORDER — SPIRONOLACTONE 25 MG PO TABS
25.0000 mg | ORAL_TABLET | Freq: Every day | ORAL | Status: DC
  Administered 2016-06-20: 25 mg via ORAL
  Filled 2016-06-19: qty 1

## 2016-06-19 MED ORDER — TORSEMIDE 20 MG PO TABS
20.0000 mg | ORAL_TABLET | Freq: Every day | ORAL | Status: DC
  Administered 2016-06-19 – 2016-06-20 (×2): 20 mg via ORAL
  Filled 2016-06-19 (×2): qty 1

## 2016-06-19 MED ORDER — DIPHENHYDRAMINE HCL 25 MG PO CAPS
25.0000 mg | ORAL_CAPSULE | Freq: Every day | ORAL | Status: DC
  Administered 2016-06-20: 25 mg via ORAL
  Filled 2016-06-19: qty 1

## 2016-06-19 MED ORDER — FENTANYL CITRATE (PF) 100 MCG/2ML IJ SOLN
INTRAMUSCULAR | Status: AC
  Filled 2016-06-19: qty 2

## 2016-06-19 MED ORDER — HEPARIN (PORCINE) IN NACL 2-0.9 UNIT/ML-% IJ SOLN
INTRAMUSCULAR | Status: AC | PRN
  Administered 2016-06-19: 500 mL

## 2016-06-19 MED ORDER — VANCOMYCIN HCL IN DEXTROSE 1-5 GM/200ML-% IV SOLN
1000.0000 mg | INTRAVENOUS | Status: AC
  Administered 2016-06-19: 1000 mg via INTRAVENOUS
  Filled 2016-06-19: qty 200

## 2016-06-19 MED ORDER — SODIUM CHLORIDE 0.9 % IV SOLN
INTRAVENOUS | Status: DC
  Administered 2016-06-19: 11:00:00 via INTRAVENOUS

## 2016-06-19 MED ORDER — MUPIROCIN 2 % EX OINT
1.0000 "application " | TOPICAL_OINTMENT | Freq: Once | CUTANEOUS | Status: AC
  Administered 2016-06-19: 1 via TOPICAL
  Filled 2016-06-19: qty 22

## 2016-06-19 MED ORDER — MIDAZOLAM HCL 5 MG/5ML IJ SOLN
INTRAMUSCULAR | Status: DC | PRN
  Administered 2016-06-19: 2 mg via INTRAVENOUS
  Administered 2016-06-19 (×3): 1 mg via INTRAVENOUS

## 2016-06-19 MED ORDER — IBUPROFEN 400 MG PO TABS
400.0000 mg | ORAL_TABLET | Freq: Four times a day (QID) | ORAL | Status: DC | PRN
  Administered 2016-06-20: 400 mg via ORAL
  Filled 2016-06-19: qty 1

## 2016-06-19 MED ORDER — ASPIRIN 81 MG PO CHEW
81.0000 mg | CHEWABLE_TABLET | Freq: Every day | ORAL | Status: DC
Start: 2016-06-20 — End: 2016-06-20
  Administered 2016-06-20: 81 mg via ORAL
  Filled 2016-06-19: qty 1

## 2016-06-19 MED ORDER — CARVEDILOL 6.25 MG PO TABS
6.2500 mg | ORAL_TABLET | Freq: Two times a day (BID) | ORAL | Status: DC
  Administered 2016-06-19 – 2016-06-20 (×2): 6.25 mg via ORAL
  Filled 2016-06-19 (×2): qty 1

## 2016-06-19 MED ORDER — CHLORHEXIDINE GLUCONATE 4 % EX LIQD
60.0000 mL | Freq: Once | CUTANEOUS | Status: DC
  Filled 2016-06-19: qty 60

## 2016-06-19 MED ORDER — FENTANYL CITRATE (PF) 100 MCG/2ML IJ SOLN
INTRAMUSCULAR | Status: DC | PRN
  Administered 2016-06-19 (×4): 25 ug via INTRAVENOUS

## 2016-06-19 MED ORDER — ONDANSETRON HCL 4 MG/2ML IJ SOLN: 4.0000 mg | Freq: Four times a day (QID) | INTRAMUSCULAR | Status: DC | PRN

## 2016-06-19 MED ORDER — LOSARTAN POTASSIUM 50 MG PO TABS
50.0000 mg | ORAL_TABLET | Freq: Every day | ORAL | Status: DC
  Administered 2016-06-20: 50 mg via ORAL
  Filled 2016-06-19: qty 1

## 2016-06-19 SURGICAL SUPPLY — 7 items
CABLE SURGICAL S-101-97-12 (CABLE) ×3 IMPLANT
HEMOSTAT SURGICEL 2X4 FIBR (HEMOSTASIS) ×3 IMPLANT
ICD VIGILANT VR D232 (Pacemaker) ×3 IMPLANT
LEAD RELIANCE G DF4 0293 (Lead) ×3 IMPLANT
PAD DEFIB LIFELINK (PAD) ×3 IMPLANT
SHEATH CLASSIC 9.5F (SHEATH) ×3 IMPLANT
TRAY PACEMAKER INSERTION (PACKS) ×3 IMPLANT

## 2016-06-19 NOTE — Discharge Instructions (Signed)
° ° °  Supplemental Discharge Instructions for  °Pacemaker/Defibrillator Patients ° °Activity °No heavy lifting or vigorous activity with your left/right arm for 6 to 8 weeks.  Do not raise your left/right arm above your head for one week.  Gradually raise your affected arm as drawn below. ° °        ° °__      06/23/16                        06/24/16                      06/25/16                 06/26/16 ° °NO DRIVING for 1 week    ; you may begin driving on    06/26/16 . ° °WOUND CARE °- Keep the wound area clean and dry.   °- No bandage is needed on the site.  DO  NOT apply any creams, oils, or ointments to the wound area. °- If you notice any drainage or discharge from the wound, any swelling or bruising at the site, or you develop a fever > 101? F after you are discharged home, call the office at once. ° °Special Instructions °- You are still able to use cellular telephones; use the ear opposite the side where you have your pacemaker/defibrillator.  Avoid carrying your cellular phone near your device. °- When traveling through airports, show security personnel your identification card to avoid being screened in the metal detectors.  Ask the security personnel to use the hand wand. °- Avoid arc welding equipment, MRI testing (magnetic resonance imaging), TENS units (transcutaneous nerve stimulators).  Call the office for questions about other devices. °- Avoid electrical appliances that are in poor condition or are not properly grounded. °- Microwave ovens are safe to be near or to operate. ° °Additional information for defibrillator patients should your device go off: °- If your device goes off ONCE and you feel fine afterward, notify the device clinic nurses. °- If your device goes off ONCE and you do not feel well afterward, call 911. °- If your device goes off TWICE, call 911. °- If your device goes off THREE times in one day, call 911. ° °DO NOT DRIVE YOURSELF OR A FAMILY MEMBER °WITH A DEFIBRILLATOR TO THE  HOSPITAL--CALL 911. ° °

## 2016-06-19 NOTE — H&P (Signed)
Patient Care Team: Dione Housekeeper, MD as PCP - General   HPI  Charlene Schmitt is a 61 y.o. female Admitted for ICD implantation for primary prevention for nonischemic cardiomyopathy  Hx  complex ventricular ectopy but  our impression that the myopathy preceded the ectopy. Hence, our initial effort was to treat both her cardiomyopathy with aldactone +/- entresto and  her PVC burden with amiodarone  ECGs were reviewed 2014--16 without evidence of ventricular ectopy ECG 1/18 demonstrated PVCs with left BBB superior axis  1/18 >> Holter  22% PVCs with multiple morphologies and multiple runs  followup ECGs and symptoms  Almost total elimination of PVCs   DATE TEST    10/13 CATH EF 15-20% NORMAL CAs  2/14 ECHO EF 25-30%   2/18 ECHO EF 25%   4/18 Echo EF 25-30%          The patient denies chest pain, shortness of breath, nocturnal dyspnea, orthopnea or peripheral edema.  There have been no palpitations, lightheadedness or syncope.       Past Medical History:  Diagnosis Date  . Chronic systolic CHF (congestive heart failure) (HCC)    a. 10/2011 Cath: nl cors, EF 15-20%;  b. 02/2012 Echo: EF 25-30%; c. 02/2016 Echo: EF 20-25%, diff HK, mild MR, mildly dil LA.  Marland Kitchen COPD (chronic obstructive pulmonary disease) (HCC)   . 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 Hypertension   . Morbid obesity (HCC)   . NICM (nonischemic cardiomyopathy) (HCC)    a. 10/2011 Cath: nl cors, EF 15-20%;  b. 02/2012 Echo: EF 25-30%; c. 02/2016 Echo: EF 20-25%.  . PVC's (premature ventricular contractions)    a. 01/2016 Holter: 22% PVC's w/ multiple morphologies with runs.  . Tobacco abuse     Past Surgical History:  Procedure Laterality Date  . CARDIAC CATHETERIZATION  11/19/2011  . GANGLION CYST EXCISION     right hand   . LACERATION REPAIR     finger; s/p stitches  . TOOTH EXTRACTION     x2    No current facility-administered medications for this  encounter.    Current Outpatient Prescriptions  Medication Sig Dispense Refill  . albuterol (PROVENTIL HFA;VENTOLIN HFA) 108 (90 BASE) MCG/ACT inhaler Inhale 2 puffs into the lungs every 6 (six) hours as needed. 1 Inhaler 6  . amiodarone (PACERONE) 200 MG tablet Take 1 tablet (200 mg total) by mouth daily. 30 tablet 6  . aspirin 81 MG tablet Take 81 mg by mouth daily.    . carvedilol (COREG) 6.25 MG tablet TAKE 1 TABLET (6.25 MG TOTAL) BY MOUTH 2 (TWO) TIMES DAILY WITH A MEAL. 180 tablet 3  . Fluticasone-Salmeterol (ADVAIR DISKUS) 250-50 MCG/DOSE AEPB Inhale 1 puff into the lungs as needed.     Marland Kitchen ibuprofen (ADVIL,MOTRIN) 200 MG tablet Take 200 mg by mouth every 6 (six) hours as needed.    Marland Kitchen losartan (COZAAR) 50 MG tablet TAKE 1 TABLET (50 MG TOTAL) BY MOUTH DAILY. 90 tablet 1  . montelukast (SINGULAIR) 10 MG tablet Take 10 mg by mouth at bedtime.    . potassium chloride SA (K-DUR,KLOR-CON) 20 MEQ tablet Take 20 mEq by mouth 2 (two) times daily.    Marland Kitchen spironolactone (ALDACTONE) 25 MG tablet Take 1 tablet (25 mg total) by mouth daily. 30 tablet 6  . torsemide (DEMADEX) 20 MG tablet Take 40 mg by mouth 2 (two) times daily.      Allergies  Allergen Reactions  .  Ace Inhibitors Other (See Comments)    Cough Cough  . Penicillins Rash      Social History  Substance Use Topics  . Smoking status: Current Every Day Smoker    Packs/day: 0.50    Years: 40.00    Types: Cigarettes  . Smokeless tobacco: Never Used  . Alcohol use Yes     Comment: occas. social drinker     Family History  Problem Relation Age of Onset  . Hypertension Mother   . Hypertension Father      No current facility-administered medications on file prior to encounter.    Current Outpatient Prescriptions on File Prior to Encounter  Medication Sig Dispense Refill  . albuterol (PROVENTIL HFA;VENTOLIN HFA) 108 (90 BASE) MCG/ACT inhaler Inhale 2 puffs into the lungs every 6 (six) hours as needed. 1 Inhaler 6  .  amiodarone (PACERONE) 200 MG tablet Take 1 tablet (200 mg total) by mouth daily. 30 tablet 6  . aspirin 81 MG tablet Take 81 mg by mouth daily.    . carvedilol (COREG) 6.25 MG tablet TAKE 1 TABLET (6.25 MG TOTAL) BY MOUTH 2 (TWO) TIMES DAILY WITH A MEAL. 180 tablet 3  . Fluticasone-Salmeterol (ADVAIR DISKUS) 250-50 MCG/DOSE AEPB Inhale 1 puff into the lungs as needed.     Marland Kitchen ibuprofen (ADVIL,MOTRIN) 200 MG tablet Take 200 mg by mouth every 6 (six) hours as needed.    Marland Kitchen losartan (COZAAR) 50 MG tablet TAKE 1 TABLET (50 MG TOTAL) BY MOUTH DAILY. 90 tablet 1  . montelukast (SINGULAIR) 10 MG tablet Take 10 mg by mouth at bedtime.    . potassium chloride SA (K-DUR,KLOR-CON) 20 MEQ tablet Take 20 mEq by mouth 2 (two) times daily.    Marland Kitchen spironolactone (ALDACTONE) 25 MG tablet Take 1 tablet (25 mg total) by mouth daily. 30 tablet 6  . torsemide (DEMADEX) 20 MG tablet Take 40 mg by mouth 2 (two) times daily.        Review of Systems negative except from HPI and PMH  Physical Exam BP (!) 143/88   Pulse 66   Temp 98.5 F (36.9 C) (Oral)   Resp 18   Ht 5\' 6"  (1.676 m)   Wt 220 lb (99.8 kg)   SpO2 99%   BMI 35.51 kg/m  Well developed and well nourished in no acute distress HENT normal E scleral and icterus clear Neck Supple JVP flat; carotids brisk and full Clear to ausculation Regular rate and rhythm, no murmurs gallops or rub Soft with active bowel sounds No clubbing cyanosis  Edema Alert and oriented, grossly normal motor and sensory function Skin Warm and Dry  ECG personally reviewed sinus 66 LVH and repol Narrow Qrs  Assessment and  Plan  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  Age within range of DANISH  trial  Have reviewed the potential benefits and risks of ICD implantation including but not limited to death, perforation of heart or lung, lead dislodgement, infection,  device malfunction and inappropriate shocks.  The patient and family express understanding  and are willing to proceed.    ICD Criteria  Current LVEF:25%. Within 12 months prior to implant: No   Heart failure history: Yes, Class II  Cardiomyopathy history: Yes, Non-Ischemic Cardiomyopathy.  Atrial Fibrillation/Atrial Flutter: No.  Ventricular  tachycardia history: No.  Cardiac arrest history: No.  History of syndromes with risk of sudden death: No.  Previous ICD: No.  Current ICD indication: Primary  PPM indication: No.   Class I or II Bradycardia indication present: No  Beta Blocker therapy for 3 or more months: Yes, prescribed.   Ace Inhibitor/ARB therapy for 3 or more months: Yes, prescribed.

## 2016-06-19 NOTE — Discharge Summary (Signed)
ELECTROPHYSIOLOGY PROCEDURE DISCHARGE SUMMARY    Patient ID: Charlene Schmitt,  MRN: [id number], DOB/AGE: [birth year] 61 y.o.  Admit date: 06/19/2016 Discharge date: 06/20/2016  Primary Care Physician: Dione Housekeeper, MD Primary Cardiologist: Mariah Milling Electrophysiologist: Graciela Husbands  Primary Discharge Diagnosis:  NICM s/p ICD implant this admission  Secondary Discharge Diagnosis:  1.  HTN 2.  Tobacco abuse 3.  Obesity 4.  PVCs 5.  COPD  Allergies  Allergen Reactions  . Ace Inhibitors Other (See Comments)    Cough Cough  . Penicillins Rash     Procedures This Admission:  1.  Implantation of a BSX single chamber ICD on 06/19/16 by Dr Graciela Husbands. See op note for full details. There were no immediate post procedure complications. 2.  CXR on 06/20/16 demonstrated no pneumothorax status post device implantation.   Brief HPI: Charlene Schmitt is a 61 y.o. female was referred to electrophysiology in the outpatient setting for consideration of ICD implantation.  Past medical history includes NICM, PVC's, hypertension..  The patient has persistent LV dysfunction despite guideline directed therapy.  Risks, benefits, and alternatives to ICD implantation were reviewed with the patient who wished to proceed.   Hospital Course:  The patient was admitted and underwent implantation of a BSX single chamber ICD with details as outlined above. She was monitored on telemetry overnight which demonstrated sinus rhythm.  Left chest was without hematoma or ecchymosis.  The device was interrogated and found to be functioning normally.  CXR was obtained and demonstrated no pneumothorax status post device implantation.  Wound care, arm mobility, and restrictions were reviewed with the patient.  The patient was examined and considered stable for discharge to home.   The patient's discharge medications include an ARB (Losartan) and beta blocker (Coreg).   Physical Exam: Vitals:   06/19/16 1525  06/19/16 2131 06/20/16 0158 06/20/16 0417  BP: 110/80 109/67  97/69  Pulse: 79 73  65  Resp:  18  18  Temp:  98.2 F (36.8 C)  98.2 F (36.8 C)  TempSrc:  Oral  Oral  SpO2: 100% 98%  95%  Weight:   218 lb 1.6 oz (98.9 kg)   Height:        GEN- The patient is well appearing, alert and oriented x 3 today.   HEENT: normocephalic, atraumatic; sclera clear, conjunctiva pink; hearing intact; oropharynx clear; neck supple  Lungs- Clear to ausculation bilaterally, normal work of breathing.  No wheezes, rales, rhonchi Heart- Regular rate and rhythm, no murmurs, rubs or gallops  GI- soft, non-tender, non-distended, bowel sounds present  Extremities- no clubbing, cyanosis, or edema  MS- no significant deformity or atrophy Skin- warm and dry, no rash or lesion, left chest without hematoma/ecchymosis Psych- euthymic mood, full affect Neuro- strength and sensation are intact   Labs:   Lab Results  Component Value Date   WBC 3.4 06/10/2016   HGB 14.1 11/17/2011   HCT 40.4 06/10/2016   MCV 92 06/10/2016   PLT 246 06/10/2016   No results for input(s): NA, K, CL, CO2, BUN, CREATININE, CALCIUM, PROT, BILITOT, ALKPHOS, ALT, AST, GLUCOSE in the last 168 hours.  Invalid input(s): LABALBU  Discharge Medications:  Allergies as of 06/20/2016      Reactions   Ace Inhibitors Other (See Comments)   Cough Cough   Penicillins Rash      Medication List    TAKE these medications   ADVAIR DISKUS 250-50 MCG/DOSE Aepb Generic drug:  Fluticasone-Salmeterol Inhale 1 puff into the  lungs as needed (for shortness of breath).   albuterol 108 (90 Base) MCG/ACT inhaler Commonly known as:  PROVENTIL HFA;VENTOLIN HFA Inhale 2 puffs into the lungs every 6 (six) hours as needed. What changed:  reasons to take this   amiodarone 200 MG tablet Commonly known as:  PACERONE Take 1 tablet (200 mg total) by mouth daily.   aspirin 81 MG tablet Take 81 mg by mouth daily.   carvedilol 6.25 MG  tablet Commonly known as:  COREG TAKE 1 TABLET (6.25 MG TOTAL) BY MOUTH 2 (TWO) TIMES DAILY WITH A MEAL.   diphenhydrAMINE 25 mg capsule Commonly known as:  BENADRYL Take 25 mg by mouth daily.   ibuprofen 200 MG tablet Commonly known as:  ADVIL,MOTRIN Take 400 mg by mouth every 6 (six) hours as needed for headache or moderate pain.   losartan 50 MG tablet Commonly known as:  COZAAR TAKE 1 TABLET (50 MG TOTAL) BY MOUTH DAILY.   potassium chloride SA 20 MEQ tablet Commonly known as:  K-DUR,KLOR-CON Take 20 mEq by mouth 2 (two) times daily.   spironolactone 25 MG tablet Commonly known as:  ALDACTONE Take 1 tablet (25 mg total) by mouth daily.   torsemide 20 MG tablet Commonly known as:  DEMADEX Take 20 mg by mouth daily.       Disposition:  Discharge Instructions    Diet - low sodium heart healthy    Complete by:  As directed    Increase activity slowly    Complete by:  As directed      Follow-up Information    [facility] Follow up on 07/03/2016.   Specialty:  Cardiology Why:  at 10AM for wound check  Contact information: [address], [address] [place] [address] [phone]       Duke Salvia, MD Follow up on 09/27/2016.   Specialty:  Cardiology Why:  at 3PM  Contact information: [address] [place] [address] [phone]           Duration of Discharge Encounter: Greater than 30 minutes including physician time.  Signed, Gypsy Balsam, NP 06/20/2016 7:02 AM

## 2016-06-20 ENCOUNTER — Ambulatory Visit (HOSPITAL_COMMUNITY): Payer: Managed Care, Other (non HMO)

## 2016-06-20 DIAGNOSIS — I428 Other cardiomyopathies: Secondary | ICD-10-CM | POA: Diagnosis not present

## 2016-07-03 ENCOUNTER — Ambulatory Visit (INDEPENDENT_AMBULATORY_CARE_PROVIDER_SITE_OTHER): Payer: Managed Care, Other (non HMO) | Admitting: *Deleted

## 2016-07-03 DIAGNOSIS — I428 Other cardiomyopathies: Secondary | ICD-10-CM | POA: Diagnosis not present

## 2016-07-03 DIAGNOSIS — I5022 Chronic systolic (congestive) heart failure: Secondary | ICD-10-CM | POA: Diagnosis not present

## 2016-07-03 LAB — CUP PACEART INCLINIC DEVICE CHECK
Brady Statistic RV Percent Paced: 1 % — CL
HIGH POWER IMPEDANCE MEASURED VALUE: 61 Ohm
Implantable Lead Implant Date: 20180523
Implantable Lead Location: 753860
Implantable Lead Model: 293
Implantable Lead Serial Number: 430811
Lead Channel Impedance Value: 651 Ohm
Lead Channel Pacing Threshold Amplitude: 0.7 V
Lead Channel Pacing Threshold Pulse Width: 0.4 ms
Lead Channel Setting Sensing Sensitivity: 0.5 mV
MDC IDC MSMT LEADCHNL RV SENSING INTR AMPL: 22 mV
MDC IDC PG IMPLANT DT: 20180523
MDC IDC PG SERIAL: 225390
MDC IDC SESS DTM: 20180606040000
MDC IDC SET LEADCHNL RV PACING AMPLITUDE: 3.5 V
MDC IDC SET LEADCHNL RV PACING PULSEWIDTH: 0.4 ms

## 2016-07-03 NOTE — Progress Notes (Signed)
Wound check appointment. Dermabond removed. Wound without redness or edema. Wound well healed aside from small scab at medial edge of incision. Normal device function. Thresholds, sensing, and impedances consistent with implant measurements. Device programmed at 3.5V for extra safety margin until 3 month visit. Histogram distribution appropriate for patient and level of activity. No ventricular arrhythmias noted. Heart Logic sleep/incline calibrated. Patient educated about wound care, arm mobility, lifting restrictions, shock plan. ROV with SK 09/27/16.

## 2016-07-04 ENCOUNTER — Encounter: Payer: Self-pay | Admitting: *Deleted

## 2016-07-04 ENCOUNTER — Telehealth: Payer: Self-pay | Admitting: *Deleted

## 2016-07-04 NOTE — Telephone Encounter (Signed)
Per patient request I asked Dr. Graciela Husbands about a work note. Charlene Schmitt cannot be written out of work but can have a letter stating her physical limitations for 6 weeks post ICD implant. I will mail this to her. She is aware.

## 2016-09-27 ENCOUNTER — Encounter: Payer: Self-pay | Admitting: Internal Medicine

## 2016-10-02 ENCOUNTER — Encounter: Payer: Self-pay | Admitting: *Deleted

## 2016-10-02 ENCOUNTER — Encounter: Payer: Self-pay | Admitting: Internal Medicine

## 2016-10-02 ENCOUNTER — Ambulatory Visit (INDEPENDENT_AMBULATORY_CARE_PROVIDER_SITE_OTHER): Payer: 59 | Admitting: Internal Medicine

## 2016-10-02 VITALS — BP 132/68 | HR 62 | Ht 66.0 in | Wt 236.0 lb

## 2016-10-02 DIAGNOSIS — I509 Heart failure, unspecified: Secondary | ICD-10-CM

## 2016-10-02 DIAGNOSIS — M1990 Unspecified osteoarthritis, unspecified site: Secondary | ICD-10-CM | POA: Insufficient documentation

## 2016-10-02 DIAGNOSIS — I5022 Chronic systolic (congestive) heart failure: Secondary | ICD-10-CM | POA: Diagnosis not present

## 2016-10-02 DIAGNOSIS — E669 Obesity, unspecified: Secondary | ICD-10-CM | POA: Insufficient documentation

## 2016-10-02 DIAGNOSIS — J309 Allergic rhinitis, unspecified: Secondary | ICD-10-CM | POA: Insufficient documentation

## 2016-10-02 DIAGNOSIS — J302 Other seasonal allergic rhinitis: Secondary | ICD-10-CM | POA: Insufficient documentation

## 2016-10-02 DIAGNOSIS — I428 Other cardiomyopathies: Secondary | ICD-10-CM

## 2016-10-02 DIAGNOSIS — I10 Essential (primary) hypertension: Secondary | ICD-10-CM | POA: Insufficient documentation

## 2016-10-02 NOTE — Progress Notes (Signed)
Patient Care Team: Dione Housekeeper, MD as PCP - General   HPI  Charlene Schmitt is a 61 y.o. female seen in followup for ICD implanted for primary prevention in  the setting of a persistent nonischemic cardiomyopathy and modest congestive heart failure. It has been our impression that the myopathy preceded the ectopy. Hence, our initial effort was to treat both her cardiomyopathy with aldactone +/- entresto and  her PVC burden with amiodarone  1/18 >> Holter  22% PVCs with multiple morphologies and multiple runs  DATE TEST    10/13 CATH EF 15-20% NORMAL CAs  2/14 ECHO   EF 25-30 %   2/18 ECHO EF 25%   4/18 Echo EF 25-30%         ECGs were reviewed 2014--16 without evidence of ventricular ectopy ECG 1/18 demonstrated PVCs with left BBB superior axis  Date TSH LFTs PFTs Cr K  5/ 18  0.659 13  1.34 4.3            The patient denies chest pain, shortness of breath, nocturnal dyspnea, orthopnea or peripheral edema.  There have been no palpitations, lightheadedness or syncope.   Some motion of the ICD   Past Medical History:  Diagnosis Date  . AICD (automatic cardioverter/defibrillator) present   . Arthritis    "some; treated w/ibuprofen" (06/19/2016)  . Chronic kidney disease   . Chronic lower back pain   . Chronic systolic CHF (congestive heart failure) (HCC)    a. 10/2011 Cath: nl cors, EF 15-20%;  b. 02/2012 Echo: EF 25-30%; c. 02/2016 Echo: EF 20-25%, diff HK, mild MR, mildly dil LA.  Marland Kitchen COPD (chronic obstructive pulmonary disease) (HCC)   . 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 Hypertension   . Morbid obesity (HCC)   . NICM (nonischemic cardiomyopathy) (HCC)    a. 10/2011 Cath: nl cors, EF 15-20%;  b. 02/2012 Echo: EF 25-30%; c. 02/2016 Echo: EF 20-25%.  . PVC's (premature ventricular contractions)    a. 01/2016 Holter: 22% PVC's w/ multiple morphologies with runs.  . Tobacco abuse     Past Surgical History:  Procedure  Laterality Date  . CARDIAC CATHETERIZATION  11/19/2011  . GANGLION CYST EXCISION Right   . ICD IMPLANT N/A 06/19/2016   Procedure: ICD Implant;  Surgeon: Duke Salvia, MD;  Location: [facility] INVASIVE CV LAB;  Service: Cardiovascular;  Laterality: N/A;  . LACERATION REPAIR     finger; s/p stitches  . TOOTH EXTRACTION     x2    Current Outpatient Prescriptions  Medication Sig Dispense Refill  . albuterol (PROVENTIL HFA;VENTOLIN HFA) 108 (90 BASE) MCG/ACT inhaler Inhale 2 puffs into the lungs every 6 (six) hours as needed. (Patient taking differently: Inhale 2 puffs into the lungs every 6 (six) hours as needed for wheezing or shortness of breath. ) 1 Inhaler 6  . amiodarone (PACERONE) 200 MG tablet Take 1 tablet (200 mg total) by mouth daily. 30 tablet 6  . aspirin 81 MG tablet Take 81 mg by mouth daily.    . carvedilol (COREG) 6.25 MG tablet TAKE 1 TABLET (6.25 MG TOTAL) BY MOUTH 2 (TWO) TIMES DAILY WITH A MEAL. 180 tablet 3  . diphenhydrAMINE (BENADRYL) 25 mg capsule Take 25 mg by mouth daily.    . Fluticasone-Salmeterol (ADVAIR DISKUS) 250-50 MCG/DOSE AEPB Inhale 1 puff into the lungs as needed (for shortness of breath).     Marland Kitchen ibuprofen (ADVIL,MOTRIN) 200 MG tablet Take  400 mg by mouth every 6 (six) hours as needed for headache or moderate pain.     Marland Kitchen losartan (COZAAR) 50 MG tablet TAKE 1 TABLET (50 MG TOTAL) BY MOUTH DAILY. 90 tablet 1  . potassium chloride SA (K-DUR,KLOR-CON) 20 MEQ tablet Take 20 mEq by mouth 2 (two) times daily.    Marland Kitchen spironolactone (ALDACTONE) 25 MG tablet Take 1 tablet (25 mg total) by mouth daily. 30 tablet 6  . torsemide (DEMADEX) 20 MG tablet Take 20 mg by mouth daily.      No current facility-administered medications for this visit.     Allergies  Allergen Reactions  . Ace Inhibitors Other (See Comments)    Cough Cough  . Penicillins Rash      Review of Systems negative except from HPI and PMH  Physical Exam BP 132/68   Pulse 62   Ht 5\' 6"  (1.676 m)    Wt 236 lb (107 kg)   BMI 38.09 kg/m  Well developed and well nourished in no acute distress HENT normal E scleral and icterus clear Neck Supple JVP flat; carotids brisk and full Clear to ausculation Device pocket well healed; without hematoma or erythema.  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  The information was reviewed. No changes were made in the programming.     BP well controlled  PVC largely quiescient  Euvolemic continue current meds  Will check amio labs  At next visit if histograms do not show significant PVC burden will decrease amio

## 2016-10-02 NOTE — Patient Instructions (Addendum)
Medication Instructions:  Your physician recommends that you continue on your current medications as directed. Please refer to the Current Medication list given to you today.   Labwork: TODAY: CMET, TSH  Testing/Procedures: None ordered  Follow-Up: Remote monitoring is used to monitor your ICD from home. This monitoring reduces the number of office visits required to check your device to one time per year. It allows [place] to keep an eye on the functioning of your device to ensure it is working properly. You are scheduled for a device check from home on 01/01/17. You may send your transmission at any time that day. If you have a wireless device, the transmission will be sent automatically. After your physician reviews your transmission, you will receive a postcard with your next transmission date.    Your physician wants you to follow-up in: 5 months with Dr. Graciela Husbands in [facility] will receive a reminder letter in the mail two months in advance. If you don't receive a letter, please call our office to schedule the follow-up appointment.   Any Other Special Instructions Will Be Listed Below (If Applicable).     If you need a refill on your cardiac medications before your next appointment, please call your pharmacy.

## 2016-10-03 LAB — COMPREHENSIVE METABOLIC PANEL
ALBUMIN: 4 g/dL (ref 3.6–4.8)
ALK PHOS: 68 IU/L (ref 39–117)
ALT: 15 IU/L (ref 0–32)
AST: 17 IU/L (ref 0–40)
Albumin/Globulin Ratio: 1.4 (ref 1.2–2.2)
BUN / CREAT RATIO: 16 (ref 12–28)
BUN: 23 mg/dL (ref 8–27)
Bilirubin Total: 0.3 mg/dL (ref 0.0–1.2)
CO2: 22 mmol/L (ref 20–29)
CREATININE: 1.43 mg/dL — AB (ref 0.57–1.00)
Calcium: 9.9 mg/dL (ref 8.7–10.3)
Chloride: 109 mmol/L — ABNORMAL HIGH (ref 96–106)
GFR calc Af Amer: 45 mL/min/{1.73_m2} — ABNORMAL LOW (ref >59)
GFR calc non Af Amer: 39 mL/min/{1.73_m2} — ABNORMAL LOW (ref >59)
GLUCOSE: 80 mg/dL (ref 65–99)
Globulin, Total: 2.9 g/dL (ref 1.5–4.5)
Potassium: 5 mmol/L (ref 3.5–5.2)
Sodium: 147 mmol/L — ABNORMAL HIGH (ref 134–144)
Total Protein: 6.9 g/dL (ref 6.0–8.5)

## 2016-10-03 LAB — CUP PACEART INCLINIC DEVICE CHECK
Date Time Interrogation Session: 20180905040000
HIGH POWER IMPEDANCE MEASURED VALUE: 68 Ohm
Implantable Lead Location: 753860
Implantable Lead Model: 293
Lead Channel Impedance Value: 745 Ohm
Lead Channel Pacing Threshold Amplitude: 0.7 V
Lead Channel Sensing Intrinsic Amplitude: 18.3 mV
Lead Channel Setting Pacing Amplitude: 3.5 V
Lead Channel Setting Pacing Pulse Width: 0.4 ms
Lead Channel Setting Sensing Sensitivity: 0.5 mV
MDC IDC LEAD IMPLANT DT: 20180523
MDC IDC LEAD SERIAL: 430811
MDC IDC MSMT LEADCHNL RV PACING THRESHOLD PULSEWIDTH: 0.4 ms
MDC IDC PG IMPLANT DT: 20180523
MDC IDC STAT BRADY RV PERCENT PACED: 1 % — AB
Pulse Gen Serial Number: 225390

## 2016-10-03 LAB — TSH: TSH: 0.606 u[IU]/mL (ref 0.450–4.500)

## 2016-10-31 ENCOUNTER — Encounter: Payer: Self-pay | Admitting: *Deleted

## 2016-12-24 ENCOUNTER — Encounter: Payer: Managed Care, Other (non HMO) | Admitting: Internal Medicine

## 2017-01-01 ENCOUNTER — Ambulatory Visit (INDEPENDENT_AMBULATORY_CARE_PROVIDER_SITE_OTHER): Payer: 59 | Admitting: *Deleted

## 2017-01-01 DIAGNOSIS — I428 Other cardiomyopathies: Secondary | ICD-10-CM | POA: Diagnosis not present

## 2017-01-01 LAB — CUP PACEART REMOTE DEVICE CHECK
Battery Remaining Longevity: 180 mo
Battery Remaining Percentage: 100 %
Date Time Interrogation Session: 20181205054200
HighPow Impedance: 78 Ohm
Implantable Lead Implant Date: 20180523
Implantable Lead Model: 293
Implantable Lead Serial Number: 430811
Implantable Pulse Generator Implant Date: 20180523
Lead Channel Impedance Value: 838 Ohm
MDC IDC LEAD LOCATION: 753860
MDC IDC PG SERIAL: 225390
MDC IDC SET LEADCHNL RV PACING AMPLITUDE: 3.5 V
MDC IDC SET LEADCHNL RV PACING PULSEWIDTH: 0.4 ms
MDC IDC SET LEADCHNL RV SENSING SENSITIVITY: 0.5 mV
MDC IDC STAT BRADY RV PERCENT PACED: 0 %

## 2017-01-01 NOTE — Progress Notes (Signed)
Remote ICD transmission.   

## 2017-01-02 ENCOUNTER — Encounter: Payer: Self-pay | Admitting: Cardiology

## 2017-01-13 ENCOUNTER — Other Ambulatory Visit: Payer: Self-pay | Admitting: *Deleted

## 2017-01-13 MED ORDER — SPIRONOLACTONE 25 MG PO TABS: 25.0000 mg | ORAL_TABLET | Freq: Every day | ORAL | 3 refills | Status: DC

## 2017-02-26 ENCOUNTER — Other Ambulatory Visit: Payer: Self-pay

## 2017-02-26 MED ORDER — AMIODARONE HCL 200 MG PO TABS: 200.0000 mg | ORAL_TABLET | Freq: Every day | ORAL | 6 refills | Status: DC

## 2017-04-02 ENCOUNTER — Ambulatory Visit (INDEPENDENT_AMBULATORY_CARE_PROVIDER_SITE_OTHER): Payer: 59 | Admitting: *Deleted

## 2017-04-02 DIAGNOSIS — I428 Other cardiomyopathies: Secondary | ICD-10-CM

## 2017-04-02 NOTE — Progress Notes (Signed)
Remote ICD transmission.   

## 2017-04-03 ENCOUNTER — Encounter: Payer: Self-pay | Admitting: Cardiology

## 2017-04-11 LAB — CUP PACEART REMOTE DEVICE CHECK
Implantable Lead Implant Date: 20180523
Implantable Lead Model: 293
Implantable Lead Serial Number: 430811
Implantable Pulse Generator Implant Date: 20180523
MDC IDC LEAD LOCATION: 753860
MDC IDC SESS DTM: 20190315153737
Pulse Gen Serial Number: 225390

## 2017-06-16 ENCOUNTER — Other Ambulatory Visit: Payer: Self-pay | Admitting: Internal Medicine

## 2017-07-02 ENCOUNTER — Ambulatory Visit (INDEPENDENT_AMBULATORY_CARE_PROVIDER_SITE_OTHER): Payer: 59 | Admitting: *Deleted

## 2017-07-02 DIAGNOSIS — I428 Other cardiomyopathies: Secondary | ICD-10-CM

## 2017-07-02 NOTE — Progress Notes (Signed)
Remote ICD transmission.   

## 2017-07-03 ENCOUNTER — Encounter: Payer: Self-pay | Admitting: Cardiology

## 2017-08-08 ENCOUNTER — Other Ambulatory Visit: Payer: Self-pay | Admitting: Internal Medicine

## 2017-08-21 LAB — CUP PACEART REMOTE DEVICE CHECK
Date Time Interrogation Session: 20190725150336
Implantable Lead Location: 753860
Implantable Lead Model: 293
MDC IDC LEAD IMPLANT DT: 20180523
MDC IDC LEAD SERIAL: 430811
MDC IDC PG IMPLANT DT: 20180523
Pulse Gen Serial Number: 225390

## 2017-10-01 ENCOUNTER — Ambulatory Visit (INDEPENDENT_AMBULATORY_CARE_PROVIDER_SITE_OTHER): Payer: 59 | Admitting: *Deleted

## 2017-10-01 DIAGNOSIS — I5022 Chronic systolic (congestive) heart failure: Secondary | ICD-10-CM

## 2017-10-01 DIAGNOSIS — I428 Other cardiomyopathies: Secondary | ICD-10-CM

## 2017-10-01 NOTE — Progress Notes (Signed)
Remote ICD transmission.   

## 2017-10-14 ENCOUNTER — Other Ambulatory Visit: Payer: Self-pay | Admitting: Internal Medicine

## 2017-10-28 LAB — CUP PACEART REMOTE DEVICE CHECK
Implantable Lead Implant Date: 20180523
Implantable Lead Location: 753860
Implantable Lead Model: 293
Implantable Lead Serial Number: 430811
MDC IDC PG IMPLANT DT: 20180523
MDC IDC SESS DTM: 20191001062729
Pulse Gen Serial Number: 225390

## 2017-12-31 ENCOUNTER — Ambulatory Visit (INDEPENDENT_AMBULATORY_CARE_PROVIDER_SITE_OTHER): Payer: Self-pay

## 2017-12-31 DIAGNOSIS — I428 Other cardiomyopathies: Secondary | ICD-10-CM

## 2017-12-31 NOTE — Progress Notes (Signed)
Remote ICD transmission.   

## 2018-01-06 ENCOUNTER — Encounter: Payer: Self-pay | Admitting: Cardiology

## 2018-02-12 ENCOUNTER — Encounter: Payer: Self-pay | Admitting: Nurse Practitioner

## 2018-02-12 ENCOUNTER — Ambulatory Visit (INDEPENDENT_AMBULATORY_CARE_PROVIDER_SITE_OTHER): Payer: PRIVATE HEALTH INSURANCE | Admitting: Nurse Practitioner

## 2018-02-12 ENCOUNTER — Telehealth: Payer: Self-pay | Admitting: Nurse Practitioner

## 2018-02-12 VITALS — BP 142/88 | HR 71 | Ht 62.0 in | Wt 236.0 lb

## 2018-02-12 DIAGNOSIS — I10 Essential (primary) hypertension: Secondary | ICD-10-CM | POA: Diagnosis not present

## 2018-02-12 DIAGNOSIS — I5022 Chronic systolic (congestive) heart failure: Secondary | ICD-10-CM | POA: Diagnosis not present

## 2018-02-12 DIAGNOSIS — I493 Ventricular premature depolarization: Secondary | ICD-10-CM

## 2018-02-12 DIAGNOSIS — I428 Other cardiomyopathies: Secondary | ICD-10-CM | POA: Diagnosis not present

## 2018-02-12 MED ORDER — TORSEMIDE 20 MG PO TABS: 20.0000 mg | ORAL_TABLET | Freq: Every day | ORAL | 3 refills | Status: DC

## 2018-02-12 MED ORDER — CARVEDILOL 6.25 MG PO TABS: ORAL_TABLET | ORAL | 3 refills | Status: DC

## 2018-02-12 MED ORDER — AMIODARONE HCL 200 MG PO TABS: 200.0000 mg | ORAL_TABLET | Freq: Every day | ORAL | 6 refills | Status: DC

## 2018-02-12 MED ORDER — HYDRALAZINE HCL 10 MG PO TABS: 10.0000 mg | ORAL_TABLET | Freq: Three times a day (TID) | ORAL | 3 refills | Status: DC

## 2018-02-12 MED ORDER — ISOSORBIDE MONONITRATE ER 30 MG PO TB24: 30.0000 mg | ORAL_TABLET | Freq: Every day | ORAL | 3 refills | Status: DC

## 2018-02-12 MED ORDER — SPIRONOLACTONE 25 MG PO TABS: 25.0000 mg | ORAL_TABLET | Freq: Every day | ORAL | 3 refills | Status: DC

## 2018-02-12 MED ORDER — POTASSIUM CHLORIDE CRYS ER 20 MEQ PO TBCR: 20.0000 meq | EXTENDED_RELEASE_TABLET | Freq: Two times a day (BID) | ORAL | 3 refills | Status: DC

## 2018-02-12 NOTE — Patient Instructions (Signed)
Medication Instructions:  Your physician has recommended you make the following change in your medication:  1. START Hydralazine 10 mg Three times a day 2. START Isosorbide mononitrate 30 mg once daily  Refills sent in for the following medications: Amiodarone Carvedilol Potassium Spironolactone Torsemide  If you need a refill on your cardiac medications before your next appointment, please call your pharmacy.   Lab work: CMET, CBC, TSH done today.  If you have labs (blood work) drawn today and your tests are completely normal, you will receive your results only by: Marland Kitchen MyChart Message (if you have MyChart) OR . A paper copy in the mail If you have any lab test that is abnormal or we need to change your treatment, we will call you to review the results.  Testing/Procedures: None  Follow-Up: At [facility], you and your health needs are our priority.  As part of our continuing mission to provide you with exceptional heart care, we have created designated Provider Care Teams.  These Care Teams include your primary Cardiologist (physician) and Advanced Practice Providers (APPs -  Physician Assistants and Nurse Practitioners) who all work together to provide you with the care you need, when you need it. You will need a follow up appointment in 4 months.  Please call our office 2 months in advance to schedule this appointment.  You may see Julien Nordmann, MD or one of the following Advanced Practice Providers on your designated Care Team:   Nicolasa Ducking, NP Eula Listen, PA-C . Marisue Ivan, PA-C  Any Other Special Instructions Will Be Listed Below (If Applicable). Please also keep your upcoming appointment with Dr. Graciela Husbands 03/17/2018 at 10:15 AM

## 2018-02-12 NOTE — Telephone Encounter (Signed)
Spoke with patient and she states that the pharmacy has been able to get these problems resolved and she is waiting on them to fill them at this time. She was appreciative for the call back and instructed her to please call us if she should have any further questions.

## 2018-02-12 NOTE — Progress Notes (Signed)
Office Visit    Patient Name: Charlene Schmitt Date of Encounter: 02/12/2018  Primary Care Provider:  Dione Housekeeper, MD Primary Cardiologist:  Julien Nordmann, MD Primary Electrophysiologist: Odessa Fleming, MD   Chief Complaint    63 year old female with history of nonischemic cardiomyopathy, hypertension, tobacco abuse, obesity, and PVCs, presents for follow-up related to heart failure.  Past Medical History    Past Medical History:  Diagnosis Date  . AICD (automatic cardioverter/defibrillator) present   . Arthritis    "some; treated w/ibuprofen" (06/19/2016)  . Chronic kidney disease   . Chronic lower back pain   . Chronic systolic CHF (congestive heart failure) (HCC)    a. 10/2011 Cath: nl cors, EF 15-20%;  b. 02/2012 Echo: EF 25-30%; c. 02/2016 Echo: EF 20-25%, diff HK, mild MR, mildly dil LA.  Marland Kitchen COPD (chronic obstructive pulmonary disease) (HCC)   . 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 Hypertension   . Morbid obesity (HCC)   . NICM (nonischemic cardiomyopathy) (HCC)    a. 10/2011 Cath: nl cors, EF 15-20%;  b. 02/2012 Echo: EF 25-30%; c. 02/2016 Echo: EF 20-25%; d. 05/2016 s/p BSX Vigilant EL single lead AICD.  Marland Kitchen PVC's (premature ventricular contractions)    a. 01/2016 Holter: 22% PVC's w/ multiple morphologies with runs.  . Tobacco abuse    Past Surgical History:  Procedure Laterality Date  . CARDIAC CATHETERIZATION  11/19/2011  . GANGLION CYST EXCISION Right   . ICD IMPLANT N/A 06/19/2016   Procedure: ICD Implant;  Surgeon: Duke Salvia, MD;  Location: [facility] INVASIVE CV LAB;  Service: Cardiovascular;  Laterality: N/A;  . LACERATION REPAIR     finger; s/p stitches  . TOOTH EXTRACTION     x2    Allergies  Allergies  Allergen Reactions  . Ace Inhibitors Other (See Comments) and Cough    Cough Cough Cough Cough   . Losartan     rash  . Penicillins Rash    History of Present Illness    63 year old female with the above complex  past medical history including nonischemic cardiomyopathy with normal coronary arteries by catheterization 2013, HFrEF with an EF of 20 to 25%, PVCs (22% on monitoring in January 2018) status post AICD in May 2018, hypertension, obesity, tobacco abuse, and COPD.  She was last seen in cardiology clinic in September 2018, though has been followed in device clinic.  Since her last visit, she has done reasonably well.  She reports compliance with her medications but does also say that on some days she skips or adjust doses based on how she is feeling.  She no longer weighs herself because the battery died in her scale and she has not replaced it.  She tries to watch her salt intake closely and rarely eats out.  She continues to smoke about a half a pack a day.  She is not interested in quitting.  She stopped her losartan last summer because she felt it was causing a rash on her arms.  Rash resolved after discontinuation.  She denies chest pain, palpitations, PND, orthopnea, dizziness, syncope, edema, or early satiety.  Home Medications    Prior to Admission medications   Medication Sig Start Date End Date Taking? Authorizing Provider  albuterol (PROVENTIL HFA;VENTOLIN HFA) 108 (90 BASE) MCG/ACT inhaler Inhale 2 puffs into the lungs every 6 (six) hours as needed. Patient taking differently: Inhale 2 puffs into the lungs every 6 (six) hours as needed for wheezing or  shortness of breath.  12/15/12   Antonieta Iba, MD  amiodarone (PACERONE) 200 MG tablet Take 1 tablet (200 mg total) by mouth daily. 02/26/17   Duke Salvia, MD  aspirin 81 MG tablet Take 81 mg by mouth daily.    [provider]  carvedilol (COREG) 6.25 MG tablet TAKE 1 TABLET (6.25 MG TOTAL) BY MOUTH 2 (TWO) TIMES DAILY WITH A MEAL. 12/01/12   Antonieta Iba, MD  diphenhydrAMINE (BENADRYL) 25 mg capsule Take 25 mg by mouth daily.    [provider]  Fluticasone-Salmeterol (ADVAIR DISKUS) 250-50 MCG/DOSE AEPB Inhale 1  puff into the lungs as needed (for shortness of breath).     [provider]  ibuprofen (ADVIL,MOTRIN) 200 MG tablet Take 400 mg by mouth every 6 (six) hours as needed for headache or moderate pain.     [provider]  losartan (COZAAR) 50 MG tablet TAKE 1 TABLET (50 MG TOTAL) BY MOUTH DAILY. 02/08/14   Antonieta Iba, MD  potassium chloride SA (K-DUR,KLOR-CON) 20 MEQ tablet Take 20 mEq by mouth 2 (two) times daily.    [provider]  spironolactone (ALDACTONE) 25 MG tablet TAKE 1 TABLET BY MOUTH EVERY DAY 08/11/17   Duke Salvia, MD  torsemide (DEMADEX) 20 MG tablet Take 20 mg by mouth daily.  12/15/12   Antonieta Iba, MD    Review of Systems    Overall doing well.  She denies chest pain, palpitations, dyspnea, pnd, orthopnea, n, v, dizziness, syncope, edema, weight gain, or early satiety.  All other systems reviewed and are otherwise negative except as noted above.  Physical Exam    VS:  BP (!) 142/88 (BP Location: Left Arm, Patient Position: Sitting, Cuff Size: Normal)   Pulse 71   Ht 5\' 2"  (1.575 m)   Wt 236 lb (107 kg)   BMI 43.16 kg/m  , BMI Body mass index is 43.16 kg/m. GEN: Obese, in no acute distress. HEENT: normal. Neck: Supple, no JVD, carotid bruits, or masses. Cardiac: RRR, no murmurs, rubs, or gallops. No clubbing, cyanosis, edema.  Radials/PT 2+ and equal bilaterally.  Respiratory:  Respirations regular and unlabored, diminished breath sounds bilaterally. GI: Obese, soft, nontender, nondistended, BS + x 4. MS: no deformity or atrophy. Skin: warm and dry, no rash. Neuro:  Strength and sensation are intact. Psych: Normal affect.  Accessory Clinical Findings    ECG personally reviewed by me today -regular sinus rhythm, 71, left axis, LVH, incomplete left bundle - no acute changes.  Assessment & Plan    1.  Nonischemic cardiomyopathy/HFrEF: EF 20 to 25%.  Status post single lead AICD in 2018.  Last seen in clinic in September  2018.  She reports reasonable compliance with medication but admits that sometimes she misses or adjust doses of her medications.  She is about to run out of everything, which is why she made a follow-up appointment.  She has not had any labs in over a year.  Fortunately, she is euvolemic on examination today and has not been symptomatic at home.  We discussed the importance of daily weights, sodium restriction, medication compliance, and symptom reporting and she verbalizes understanding.  She stopped losartan last summer in the setting of a rash on her forearms which subsequently resolved.  I am going to refill her carvedilol, spironolactone, and torsemide.  We discussed adding BiDil however it may be cost prohibitive and therefore instead, I am adding hydralazine 10 mg 3 times daily  and isosorbide mononitrate 30 mg daily.  Chose not to try Entresto in setting of rash with losartan.  Follow-up CBC, complete metabolic panel, and TSH today.  She has follow-up with EP in February.  2.  Essential hypertension: Blood pressure elevated today at 142/88.  Adding hydralazine and nitrate as above.  Refilling beta-blocker, diuretic, and spironolactone.  3.  History of PVCs: No report palpitations on amiodarone therapy.  Follow-up labs today.  4.  Tobacco abuse: Continues to smoke about a half a pack a day.  Complete cessation advised.  She is not interested in considering quitting at this time.  5.  Disposition: Follow-up labs today.  Follow-up with electrophysiology in February.  Follow-up with Dr. Mariah MillingGollan in approximately 4 months or sooner if necessary.  I stressed the importance of regular follow-up and compliance.  Nicolasa Duckinghristopher Caydence Koenig, NP 02/12/2018, 9:49 AM

## 2018-02-12 NOTE — Telephone Encounter (Signed)
Pt c/o medication issue:  1. Name of Medication: Potassium and Hydralazine   2. How are you currently taking this medication (dosage and times per day)? new  3. Are you having a reaction (difficulty breathing--STAT)? no  4. What is your medication issue? Per patient she tried hydralazine in the past and it interacted with other meds causing issues and pharmacy says potassium is not covered by insurance please call to discuss

## 2018-02-13 LAB — COMPREHENSIVE METABOLIC PANEL
ALBUMIN: 3.9 g/dL (ref 3.6–4.8)
ALK PHOS: 76 IU/L (ref 39–117)
ALT: 19 IU/L (ref 0–32)
AST: 18 IU/L (ref 0–40)
Albumin/Globulin Ratio: 1.3 (ref 1.2–2.2)
BILIRUBIN TOTAL: 0.4 mg/dL (ref 0.0–1.2)
BUN / CREAT RATIO: 15 (ref 12–28)
BUN: 19 mg/dL (ref 8–27)
CHLORIDE: 108 mmol/L — AB (ref 96–106)
CO2: 23 mmol/L (ref 20–29)
CREATININE: 1.28 mg/dL — AB (ref 0.57–1.00)
Calcium: 9.8 mg/dL (ref 8.7–10.3)
GFR calc Af Amer: 51 mL/min/{1.73_m2} — ABNORMAL LOW (ref >59)
GFR calc non Af Amer: 44 mL/min/{1.73_m2} — ABNORMAL LOW (ref >59)
GLOBULIN, TOTAL: 2.9 g/dL (ref 1.5–4.5)
Glucose: 105 mg/dL — ABNORMAL HIGH (ref 65–99)
POTASSIUM: 4.9 mmol/L (ref 3.5–5.2)
SODIUM: 146 mmol/L — AB (ref 134–144)
Total Protein: 6.8 g/dL (ref 6.0–8.5)

## 2018-02-13 LAB — CBC WITH DIFFERENTIAL/PLATELET
BASOS ABS: 0 10*3/uL (ref 0.0–0.2)
Basos: 1 %
EOS (ABSOLUTE): 0.2 10*3/uL (ref 0.0–0.4)
EOS: 6 %
HEMATOCRIT: 40.8 % (ref 34.0–46.6)
Hemoglobin: 13.6 g/dL (ref 11.1–15.9)
IMMATURE GRANULOCYTES: 0 %
Immature Grans (Abs): 0 10*3/uL (ref 0.0–0.1)
LYMPHS ABS: 1.1 10*3/uL (ref 0.7–3.1)
Lymphs: 34 %
MCH: 29.6 pg (ref 26.6–33.0)
MCHC: 33.3 g/dL (ref 31.5–35.7)
MCV: 89 fL (ref 79–97)
MONOS ABS: 0.4 10*3/uL (ref 0.1–0.9)
Monocytes: 12 %
NEUTROS PCT: 47 %
Neutrophils Absolute: 1.5 10*3/uL (ref 1.4–7.0)
PLATELETS: 259 10*3/uL (ref 150–450)
RBC: 4.6 x10E6/uL (ref 3.77–5.28)
RDW: 13.8 % (ref 11.7–15.4)
WBC: 3.3 10*3/uL — AB (ref 3.4–10.8)

## 2018-02-13 LAB — TSH: TSH: 0.618 u[IU]/mL (ref 0.450–4.500)

## 2018-02-20 LAB — CUP PACEART REMOTE DEVICE CHECK
Date Time Interrogation Session: 20200124202719
Implantable Lead Location: 753860
Implantable Lead Model: 293
Implantable Lead Serial Number: 430811
MDC IDC LEAD IMPLANT DT: 20180523
MDC IDC PG IMPLANT DT: 20180523
Pulse Gen Serial Number: 225390

## 2018-03-16 NOTE — Progress Notes (Signed)
Patient Care Team: Dione Housekeeper, MD as PCP - General [name], MD as PCP - Cardiology (Cardiology) Duke Salvia, MD as PCP - Electrophysiology (Cardiology)   HPI  Charlene Schmitt is a 63 y.o. female seen in followup for ICD implanted for primary prevention in  the setting of a persistent nonischemic cardiomyopathy and modest congestive heart failure. It has been our impression that the myopathy preceded the ectopy. Hence, our initial effort was to treat both her cardiomyopathy with aldactone +/- entresto and  her PVC burden with amiodarone  1/18 >> Holter  22% PVCs with multiple morphologies and multiple runs  DATE TEST    10/13 CATH EF 15-20% NORMAL CAs  2/14 ECHO   EF 25-30 %   2/18 ECHO EF 25%   4/18 Echo EF 25-30%         ECGs were reviewed 2014--16 without evidence of ventricular ectopy ECG 1/18 demonstrated PVCs with left BBB superior axis  Date TSH LFTs PFTs Cr K  5/ 18  0.659 13  1.34 4.3  1/20 0.618 15   1.28 4.9  Patient denies symptoms of GI intolerance, sun sensitivity, neurological symptoms attributable to amiodarone.      She is doing well overall. She reports intermittent SOB depending on how hard she works. Associated symptoms include a dry cough with occasionally coughing up phlegm. She understands this is partially related to her smoking. She does stay active walking, adding she will get in about 7,000 steps a day. Noting her legs will start getting tired and hurting around noon after she has already been walking around for a couple hours for work. She does snore and reports no interest in a sleep study. Most of the time she takes her medication as prescribed, however, at times she doesn't take all of them because she can feel her body not absorbing them. She denies nausea, extremity edema or any other related symptoms at this time.   Past Medical History:  Diagnosis Date   AICD (automatic cardioverter/defibrillator) present     Arthritis    "some; treated w/ibuprofen" (06/19/2016)   Chronic kidney disease    Chronic lower back pain    Chronic systolic CHF (congestive heart failure) (HCC)    a. 10/2011 Cath: nl cors, EF 15-20%;  b. 02/2012 Echo: EF 25-30%; c. 02/2016 Echo: EF 20-25%, diff HK, mild MR, mildly dil LA.   COPD (chronic obstructive pulmonary disease) (HCC)    History of recurrent miscarriages, not currently pregnant    Hypertension    Morbid obesity (HCC)    NICM (nonischemic cardiomyopathy) (HCC)    a. 10/2011 Cath: nl cors, EF 15-20%;  b. 02/2012 Echo: EF 25-30%; c. 02/2016 Echo: EF 20-25%; d. 05/2016 s/p BSX Vigilant EL single lead AICD.   PVC's (premature ventricular contractions)    a. 01/2016 Holter: 22% PVC's w/ multiple morphologies with runs.   Tobacco abuse     Past Surgical History:  Procedure Laterality Date   CARDIAC CATHETERIZATION  11/19/2011   GANGLION CYST EXCISION Right    ICD IMPLANT N/A 06/19/2016   Procedure: ICD Implant;  Surgeon: Duke Salvia, MD;  Location: [facility] INVASIVE CV LAB;  Service: Cardiovascular;  Laterality: N/A;   LACERATION REPAIR     finger; s/p stitches   TOOTH EXTRACTION     x2    Current Outpatient Medications  Medication Sig Dispense Refill   albuterol (PROVENTIL HFA;VENTOLIN HFA) 108 (90 BASE) MCG/ACT inhaler Inhale 2  puffs into the lungs every 6 (six) hours as needed. (Patient taking differently: Inhale 2 puffs into the lungs every 6 (six) hours as needed for wheezing or shortness of breath. ) 1 Inhaler 6   amiodarone (PACERONE) 200 MG tablet Take 1 tablet (200 mg total) by mouth daily. 30 tablet 6   aspirin 81 MG tablet Take 81 mg by mouth daily.     carvedilol (COREG) 6.25 MG tablet TAKE 1 TABLET (6.25 MG TOTAL) BY MOUTH 2 (TWO) TIMES DAILY WITH A MEAL. 180 tablet 3   diphenhydrAMINE (BENADRYL) 25 mg capsule Take 25 mg by mouth daily.     Fluticasone-Salmeterol (ADVAIR DISKUS) 250-50 MCG/DOSE AEPB Inhale 1 puff into the lungs as  needed (for shortness of breath).      hydrALAZINE (APRESOLINE) 10 MG tablet Take 1 tablet (10 mg total) by mouth 3 (three) times daily. 270 tablet 3   ibuprofen (ADVIL,MOTRIN) 200 MG tablet Take 400 mg by mouth every 6 (six) hours as needed for headache or moderate pain.      isosorbide mononitrate (IMDUR) 30 MG 24 hr tablet Take 1 tablet (30 mg total) by mouth daily. 90 tablet 3   potassium chloride SA (K-DUR,KLOR-CON) 20 MEQ tablet Take 1 tablet (20 mEq total) by mouth 2 (two) times daily. 180 tablet 3   spironolactone (ALDACTONE) 25 MG tablet Take 1 tablet (25 mg total) by mouth daily. 90 tablet 3   torsemide (DEMADEX) 20 MG tablet Take 1 tablet (20 mg total) by mouth daily. 90 tablet 3   No current facility-administered medications for this visit.     Allergies  Allergen Reactions   Ace Inhibitors Other (See Comments) and Cough    Cough Cough Cough Cough    Losartan     rash   Penicillins Rash      Review of Systems negative except from HPI and PMH  Physical Exam BP 112/73 (BP Location: Left Arm, Patient Position: Sitting, Cuff Size: Normal)    Pulse 75    Ht 5\' 6"  (1.676 m)    Wt 229 lb 8 oz (104.1 kg)    BMI 37.04 kg/m  Well developed and Morbidly obese in no acute distress HENT normal Neck supple with JVP-flat Clear Device pocket well healed; without hematoma or erythema.  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 I have reviewed the above documentation for accuracy and completeness, and I agree with the above.    Signed, Sherryl Manges, MD 03/17/18 [facility] [place], [place] [phone]

## 2018-03-17 ENCOUNTER — Ambulatory Visit: Payer: PRIVATE HEALTH INSURANCE | Admitting: Internal Medicine

## 2018-03-17 ENCOUNTER — Encounter: Payer: Self-pay | Admitting: Internal Medicine

## 2018-03-17 VITALS — BP 112/73 | HR 75 | Ht 66.0 in | Wt 229.5 lb

## 2018-03-17 DIAGNOSIS — I428 Other cardiomyopathies: Secondary | ICD-10-CM | POA: Diagnosis not present

## 2018-03-17 DIAGNOSIS — Z95810 Presence of automatic (implantable) cardiac defibrillator: Secondary | ICD-10-CM

## 2018-03-17 DIAGNOSIS — I472 Ventricular tachycardia: Secondary | ICD-10-CM

## 2018-03-17 DIAGNOSIS — I5022 Chronic systolic (congestive) heart failure: Secondary | ICD-10-CM

## 2018-03-17 DIAGNOSIS — I4729 Other ventricular tachycardia: Secondary | ICD-10-CM

## 2018-03-17 DIAGNOSIS — Z79899 Other long term (current) drug therapy: Secondary | ICD-10-CM

## 2018-03-17 DIAGNOSIS — I739 Peripheral vascular disease, unspecified: Secondary | ICD-10-CM

## 2018-03-17 DIAGNOSIS — I493 Ventricular premature depolarization: Secondary | ICD-10-CM

## 2018-03-17 NOTE — Patient Instructions (Signed)
Medication Instructions:  - Your physician recommends that you continue on your current medications as directed. Please refer to the Current Medication list given to you today.  If you need a refill on your cardiac medications before your next appointment, please call your pharmacy.   Lab work: - none ordered  If you have labs (blood work) drawn today and your tests are completely normal, you will receive your results only by: Marland Kitchen MyChart Message (if you have MyChart) OR . A paper copy in the mail If you have any lab test that is abnormal or we need to change your treatment, we will call you to review the results.  Testing/Procedures: Your physician has requested that you have an ankle brachial index (ABI) and lower arterial duplex. During this test an ultrasound and blood pressure cuff are used to evaluate the arteries that supply the arms and legs with blood. Allow thirty minutes for this exam. There are no restrictions or special instructions.  Follow-Up: At [facility], you and your health needs are our priority.  As part of our continuing mission to provide you with exceptional heart care, we have created designated Provider Care Teams.  These Care Teams include your primary Cardiologist (physician) and Advanced Practice Providers (APPs -  Physician Assistants and Nurse Practitioners) who all work together to provide you with the care you need, when you need it. . You will need a follow up appointment in 1 year with Dr. Graciela Husbands.  Please call our office 2 months in advance to schedule this appointment.    Remote monitoring is used to monitor your Pacemaker of ICD from home. This monitoring reduces the number of office visits required to check your device to one time per year. It allows [place] to keep an eye on the functioning of your device to ensure it is working properly. You are scheduled for a device check from home on 04/01/2018. You may send your transmission at any time that day. If you have  a wireless device, the transmission will be sent automatically. After your physician reviews your transmission, you will receive a postcard with your next transmission date.   Any Other Special Instructions Will Be Listed Below (If Applicable). - N/A

## 2018-04-01 ENCOUNTER — Ambulatory Visit (INDEPENDENT_AMBULATORY_CARE_PROVIDER_SITE_OTHER): Payer: PRIVATE HEALTH INSURANCE | Admitting: *Deleted

## 2018-04-01 DIAGNOSIS — I428 Other cardiomyopathies: Secondary | ICD-10-CM | POA: Diagnosis not present

## 2018-04-04 LAB — CUP PACEART REMOTE DEVICE CHECK
Battery Remaining Longevity: 180 mo
Battery Remaining Percentage: 100 %
Brady Statistic RV Percent Paced: 0 %
Date Time Interrogation Session: 20200302054100
HIGH POWER IMPEDANCE MEASURED VALUE: 72 Ohm
Implantable Lead Location: 753860
Implantable Lead Model: 293
Implantable Lead Serial Number: 430811
Lead Channel Setting Sensing Sensitivity: 0.5 mV
MDC IDC LEAD IMPLANT DT: 20180523
MDC IDC MSMT LEADCHNL RV IMPEDANCE VALUE: 882 Ohm
MDC IDC PG IMPLANT DT: 20180523
MDC IDC PG SERIAL: 225390
MDC IDC SET LEADCHNL RV PACING AMPLITUDE: 2.5 V
MDC IDC SET LEADCHNL RV PACING PULSEWIDTH: 0.4 ms

## 2018-04-08 ENCOUNTER — Encounter: Payer: Self-pay | Admitting: Cardiology

## 2018-04-08 NOTE — Progress Notes (Signed)
Remote ICD transmission.   

## 2018-05-26 ENCOUNTER — Ambulatory Visit (INDEPENDENT_AMBULATORY_CARE_PROVIDER_SITE_OTHER): Payer: PRIVATE HEALTH INSURANCE

## 2018-05-26 ENCOUNTER — Other Ambulatory Visit: Payer: Self-pay

## 2018-05-26 DIAGNOSIS — I739 Peripheral vascular disease, unspecified: Secondary | ICD-10-CM

## 2018-06-01 ENCOUNTER — Telehealth: Payer: Self-pay | Admitting: Internal Medicine

## 2018-06-01 NOTE — Telephone Encounter (Signed)
Notes recorded by Jefferey Pica, RN on 06/01/2018 at 2:51 PM EDT Attempted to call the patient- no answer & no voice mail set up. I will attempt to call her back at a later time. Phone note started.

## 2018-06-01 NOTE — Telephone Encounter (Signed)
Per Dr. Graciela Husbands regarding ABI results: Please Inform Patient that study was normal    Thanks

## 2018-07-01 ENCOUNTER — Ambulatory Visit (INDEPENDENT_AMBULATORY_CARE_PROVIDER_SITE_OTHER): Payer: PRIVATE HEALTH INSURANCE | Admitting: *Deleted

## 2018-07-01 DIAGNOSIS — I428 Other cardiomyopathies: Secondary | ICD-10-CM

## 2018-07-02 ENCOUNTER — Telehealth: Payer: Self-pay

## 2018-07-02 NOTE — Telephone Encounter (Signed)
Unable to leave a message for patient to remind of missed remote transmission.  

## 2018-07-07 LAB — CUP PACEART REMOTE DEVICE CHECK
Date Time Interrogation Session: 20200609090624
Implantable Lead Implant Date: 20180523
Implantable Lead Location: 753860
Implantable Lead Model: 293
Implantable Lead Serial Number: 430811
Implantable Pulse Generator Implant Date: 20180523
Pulse Gen Serial Number: 225390

## 2018-07-08 ENCOUNTER — Telehealth: Payer: Self-pay

## 2018-07-08 NOTE — Progress Notes (Signed)
Virtual Visit via Telephone Note   This visit type was conducted due to national recommendations for restrictions regarding the COVID-19 Pandemic (e.g. social distancing) in an effort to limit this patient's exposure and mitigate transmission in our community.  Due to her co-morbid illnesses, this patient is at least at moderate risk for complications without adequate follow up.  This format is felt to be most appropriate for this patient at this time.  The patient did not have access to video technology/had technical difficulties with video requiring transitioning to audio format only (telephone).  All issues noted in this document were discussed and addressed.  No physical exam could be performed with this format.  Please refer to the patient's chart for her  consent to telehealth for [facility].   I connected with  [name] on 07/09/18 by a video enabled telemedicine application and verified that I am speaking with the correct person using two identifiers. I discussed the limitations of evaluation and management by telemedicine. The patient expressed understanding and agreed to proceed.   Evaluation Performed:  Follow-up visit  Date:  07/09/2018   ID:  [name], DOB 30-May-1955, MRN [id number]  Patient Location:  [place] [address]   Provider location:   [facility] [place] office  PCP:  [name], MD  Cardiologist:  [name] [facility]   Chief Complaint: Abdominal bloating    History of Present Illness:    Charlene Schmitt is a 63 y.o. female who presents via audio/video conferencing for a telehealth visit today.   The patient does not symptoms concerning for COVID-19 infection (fever, chills, cough, or new SHORTNESS OF BREATH).   Patient has a past medical history of  obesity,  hypertension,  nonischemic cardiomyopathy,  ejection fraction less than 25%, In 2018 cardiac catheterization showing no  significant coronary artery disease, normal right ventricular systolic pressures on echocardiogram.  smoker She presents for routine follow up for her cardiomyopathy.  ICD in 05/2016  ABI normal  124/82, Weight 230 pounds   No SOB,  No edema Torsemide daily  Continues to Smoke  No regular exercise program but she does walk miles a day at work Works at [employer]  Having problems with discomfort in her leg.   x-rays showing L2-L5 significant disc disease  Other past medical history  Previous renal dysfunction in the setting of NSAIDs and diuresis renal ultrasound 05/29/2011 showing no hydronephrosis seen by renal service at [facility]  Total cholesterol now 200    Prior CV studies:   The following studies were reviewed today:   Past Medical History:  Diagnosis Date  . AICD (automatic cardioverter/defibrillator) present   . Arthritis    "some; treated w/ibuprofen" (06/19/2016)  . Chronic kidney disease   . Chronic lower back pain   . Chronic systolic CHF (congestive heart failure) ([place])    a. 10/2011 Cath: nl cors, EF 15-20%;  b. 02/2012 Echo: EF 25-30%; c. 02/2016 Echo: EF 20-25%, diff HK, mild MR, mildly dil LA.  Marland Kitchen COPD (chronic obstructive pulmonary disease) ([place])   . 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 Hypertension   . Morbid obesity ([place])   . NICM (nonischemic cardiomyopathy) ([place])    a. 10/2011 Cath: nl cors, EF 15-20%;  b. 02/2012 Echo: EF 25-30%; c. 02/2016 Echo: EF 20-25%; d. 05/2016 s/p BSX Vigilant EL single lead AICD.  Marland Kitchen PVC's (premature ventricular contractions)    a. 01/2016 Holter: 22%  PVC's w/ multiple morphologies with runs.  . Tobacco abuse    Past Surgical History:  Procedure Laterality Date  . CARDIAC CATHETERIZATION  11/19/2011  . GANGLION CYST EXCISION Right   . ICD IMPLANT N/A 06/19/2016   Procedure: ICD Implant;  Surgeon: Duke SalviaKlein, Steven C, MD;  [facility] INVASIVE CV LAB;  Service: Cardiovascular;  Laterality: N/A;   . LACERATION REPAIR     finger; s/p stitches  . TOOTH EXTRACTION     x2     No outpatient medications have been marked as taking for the 07/09/18 encounter (Appointment) with Antonieta IbaGollan, Timothy J, MD.     Allergies:   Ace inhibitors, Losartan, and Penicillins   Social History   Tobacco Use  . Smoking status: Current Every Day Smoker    Packs/day: 0.50    Years: 48.00    Pack years: 24.00    Types: Cigarettes  . Smokeless tobacco: Never Used  Substance Use Topics  . Alcohol use: Yes    Comment: 06/19/2016 "nothing in years"  . Drug use: No     Current Outpatient Medications on File Prior to Visit  Medication Sig Dispense Refill  . albuterol (PROVENTIL HFA;VENTOLIN HFA) 108 (90 BASE) MCG/ACT inhaler Inhale 2 puffs into the lungs every 6 (six) hours as needed. (Patient taking differently: Inhale 2 puffs into the lungs every 6 (six) hours as needed for wheezing or shortness of breath. ) 1 Inhaler 6  . amiodarone (PACERONE) 200 MG tablet Take 1 tablet (200 mg total) by mouth daily. 30 tablet 6  . aspirin 81 MG tablet Take 81 mg by mouth daily.    . carvedilol (COREG) 6.25 MG tablet TAKE 1 TABLET (6.25 MG TOTAL) BY MOUTH 2 (TWO) TIMES DAILY WITH A MEAL. 180 tablet 3  . diphenhydrAMINE (BENADRYL) 25 mg capsule Take 25 mg by mouth daily.    . Fluticasone-Salmeterol (ADVAIR DISKUS) 250-50 MCG/DOSE AEPB Inhale 1 puff into the lungs as needed (for shortness of breath).     . hydrALAZINE (APRESOLINE) 10 MG tablet Take 1 tablet (10 mg total) by mouth 3 (three) times daily. 270 tablet 3  . ibuprofen (ADVIL,MOTRIN) 200 MG tablet Take 400 mg by mouth every 6 (six) hours as needed for headache or moderate pain.     . isosorbide mononitrate (IMDUR) 30 MG 24 hr tablet Take 1 tablet (30 mg total) by mouth daily. 90 tablet 3  . potassium chloride SA (K-DUR,KLOR-CON) 20 MEQ tablet Take 1 tablet (20 mEq total) by mouth 2 (two) times daily. 180 tablet 3  . spironolactone (ALDACTONE) 25 MG tablet Take 1  tablet (25 mg total) by mouth daily. 90 tablet 3  . torsemide (DEMADEX) 20 MG tablet Take 1 tablet (20 mg total) by mouth daily. 90 tablet 3   No current facility-administered medications on file prior to visit.      Family Hx: The patient's family history includes Hypertension in her father and mother.  ROS:   Please see the history of present illness.    Review of Systems  Constitutional: Negative.   Respiratory: Negative.   Cardiovascular: Negative.   Gastrointestinal: Negative.   Musculoskeletal: Negative.   Neurological: Negative.   Psychiatric/Behavioral: Negative.   All other systems reviewed and are negative.    Labs/Other Tests and Data Reviewed:    Recent Labs: 02/12/2018: ALT 19; BUN 19; Creatinine, Ser 1.28; Hemoglobin 13.6; Platelets 259; Potassium 4.9; Sodium 146; TSH 0.618   Recent Lipid Panel Lab Results  Component Value Date/Time  CHOL 172 11/18/2011 04:55 AM   TRIG 89 11/18/2011 04:55 AM   HDL 74 (H) 11/18/2011 04:55 AM   LDLCALC 80 11/18/2011 04:55 AM    Wt Readings from Last 3 Encounters:  03/17/18 229 lb 8 oz (104.1 kg)  02/12/18 236 lb (107 kg)  10/02/16 236 lb (107 kg)     Exam:    Vital Signs: Vital signs may also be detailed in the HPI There were no vitals taken for this visit.  Wt Readings from Last 3 Encounters:  03/17/18 229 lb 8 oz (104.1 kg)  02/12/18 236 lb (107 kg)  10/02/16 236 lb (107 kg)   Temp Readings from Last 3 Encounters:  06/20/16 98.2 F (36.8 C) (Oral)   BP Readings from Last 3 Encounters:  03/17/18 112/73  02/12/18 (!) 142/88  10/02/16 132/68   Pulse Readings from Last 3 Encounters:  03/17/18 75  02/12/18 71  10/02/16 62    124/82, Weight 230 pounds Pulse 70 resp 16  Well nourished, well developed female in no acute distress. Constitutional:  oriented to person, place, and time. No distress.    ASSESSMENT & PLAN:    1. Nonischemic cardiomyopathy (HCC) Some medication confusion We will have to  call her back to get accurate medication list She is not taking hydralazine on a consistent basis Unclear if she is taking spironolactone or isosorbide, we will call her back when she has her pill bottles in front of her and update her medication list  2. NSVT (nonsustained ventricular tachycardia) (HCC) Denies any tachycardia palpitations, near-syncope or syncope Device followed by EP  3. Chronic systolic heart failure (HCC) Reports that she is euvolemic, denies any leg swelling Periodically has abdominal bloating swelling, not now Titrates her torsemide up and down as needed  4. ICD (implantable cardioverter-defibrillator) in place Device placed May 2018 She denies any problems  5. Frequent PVCs Asymptomatic  6. Claudication (HCC) ABIs negative, just has chronic leg pain She feels this could be secondary to lumbar sacral disease  7. Essential hypertension Blood pressure stable 120 systolic, we will try to call her back at a later date to update her medication list She is not taking select medications  8. Chronic systolic CHF (congestive heart failure) (HCC) As above feels euvolemic   COVID-19 Education: The signs and symptoms of COVID-19 were discussed with the patient and how to seek care for testing (follow up with PCP or arrange E-visit).  The importance of social distancing was discussed today.  Patient Risk:   After full review of this patients clinical status, I feel that they are at least moderate risk at this time.  Time:   Today, I have spent 25 minutes with the patient with telehealth technology discussing the cardiac and medical problems/diagnoses detailed above   10 min spent reviewing the chart prior to patient visit today   Medication Adjustments/Labs and Tests Ordered: Current medicines are reviewed at length with the patient today.  Concerns regarding medicines are outlined above.   Tests Ordered: No tests ordered   Medication Changes: No changes  made   Disposition: Follow-up in 12 months   Signed, Julien Nordmann, MD  07/09/2018 4:30 PM    [facility] [address], [place], [place] [address]

## 2018-07-08 NOTE — Telephone Encounter (Signed)
Virtual Visit Pre-Appointment Phone Call  "[name], I am calling you today to discuss your upcoming appointment. We are currently trying to limit exposure to the virus that causes COVID-19 by seeing patients at home rather than in the office."  1. "What is the BEST phone number to call the day of the visit?" - include this in appointment notes  2. Do you have or have access to (through a family member/friend) a smartphone with video capability that we can use for your visit?" a. If yes - list this number in appt notes as cell (if different from BEST phone #) and list the appointment type as a VIDEO visit in appointment notes b. If no - list the appointment type as a PHONE visit in appointment notes  3. Confirm consent - "In the setting of the current Covid19 crisis, you are scheduled for a phone visit with your provider on 07/09/2018 at 4:20PM.  Just as we do with many in-office visits, in order for you to participate in this visit, we must obtain consent.  If you'd like, I can send this to your mychart (if signed up) or email for you to review.  Otherwise, I can obtain your verbal consent now.  All virtual visits are billed to your insurance company just like a normal visit would be.  By agreeing to a virtual visit, we'd like you to understand that the technology does not allow for your provider to perform an examination, and thus may limit your provider's ability to fully assess your condition. If your provider identifies any concerns that need to be evaluated in person, we will make arrangements to do so.  Finally, though the technology is pretty good, we cannot assure that it will always work on either your or our end, and in the setting of a video visit, we may have to convert it to a phone-only visit.  In either situation, we cannot ensure that we have a secure connection.  Are you willing to proceed?" STAFF: Did the patient verbally acknowledge consent to telehealth visit? Document YES/NO  here: YES  4. Advise patient to be prepared - "Two hours prior to your appointment, go ahead and check your blood pressure, pulse, oxygen saturation, and your weight (if you have the equipment to check those) and write them all down. When your visit starts, your provider will ask you for this information. If you have an Apple Watch or Kardia device, please plan to have heart rate information ready on the day of your appointment. Please have a pen and paper handy nearby the day of the visit as well."  5. Give patient instructions for MyChart download to smartphone OR [email or web address] as below if video visit (depending on what platform provider is using)  6. Inform patient they will receive a phone call 15 minutes prior to their appointment time (may be from unknown caller ID) so they should be prepared to answer    TELEPHONE CALL NOTE  Charlene Schmitt has been deemed a candidate for a follow-up tele-health visit to limit community exposure during the Covid-19 pandemic. I spoke with the patient via phone to ensure availability of phone/video source, confirm preferred email & phone number, and discuss instructions and expectations.  I reminded Charlene Schmitt to be prepared with any vital sign and/or heart rhythm information that could potentially be obtained via home monitoring, at the time of her visit. I reminded Charlene Schmitt to expect a phone call prior to her visit.  Charlene Schmitt L  Thayer Headings 07/08/2018 11:27 AM    FULL LENGTH CONSENT FOR TELE-HEALTH VISIT   I hereby voluntarily request, consent and authorize [facility] and its employed or contracted physicians, physician assistants, nurse practitioners or other licensed health care professionals (the Practitioner), to provide me with telemedicine health care services (the Services") as deemed necessary by the treating Practitioner. I acknowledge and consent to receive the Services by the Practitioner via telemedicine. I  understand that the telemedicine visit will involve communicating with the Practitioner through live audiovisual communication technology and the disclosure of certain medical information by electronic transmission. I acknowledge that I have been given the opportunity to request an in-person assessment or other available alternative prior to the telemedicine visit and am voluntarily participating in the telemedicine visit.  I understand that I have the right to withhold or withdraw my consent to the use of telemedicine in the course of my care at any time, without affecting my right to future care or treatment, and that the Practitioner or I may terminate the telemedicine visit at any time. I understand that I have the right to inspect all information obtained and/or recorded in the course of the telemedicine visit and may receive copies of available information for a reasonable fee.  I understand that some of the potential risks of receiving the Services via telemedicine include:   Delay or interruption in medical evaluation due to technological equipment failure or disruption;  Information transmitted may not be sufficient (e.g. poor resolution of images) to allow for appropriate medical decision making by the Practitioner; and/or   In rare instances, security protocols could fail, causing a breach of personal health information.  Furthermore, I acknowledge that it is my responsibility to provide information about my medical history, conditions and care that is complete and accurate to the best of my ability. I acknowledge that Practitioner's advice, recommendations, and/or decision may be based on factors not within their control, such as incomplete or inaccurate data provided by me or distortions of diagnostic images or specimens that may result from electronic transmissions. I understand that the practice of medicine is not an exact science and that Practitioner makes no warranties or guarantees  regarding treatment outcomes. I acknowledge that I will receive a copy of this consent concurrently upon execution via email to the email address I last provided but may also request a printed copy by calling the office of [facility].    I understand that my insurance will be billed for this visit.   I have read or had this consent read to me.  I understand the contents of this consent, which adequately explains the benefits and risks of the Services being provided via telemedicine.   I have been provided ample opportunity to ask questions regarding this consent and the Services and have had my questions answered to my satisfaction.  I give my informed consent for the services to be provided through the use of telemedicine in my medical care  By participating in this telemedicine visit I agree to the above.

## 2018-07-09 ENCOUNTER — Telehealth (INDEPENDENT_AMBULATORY_CARE_PROVIDER_SITE_OTHER): Payer: PRIVATE HEALTH INSURANCE | Admitting: Cardiovascular Disease

## 2018-07-09 ENCOUNTER — Other Ambulatory Visit: Payer: Self-pay

## 2018-07-09 DIAGNOSIS — I493 Ventricular premature depolarization: Secondary | ICD-10-CM

## 2018-07-09 DIAGNOSIS — I509 Heart failure, unspecified: Secondary | ICD-10-CM

## 2018-07-09 DIAGNOSIS — I428 Other cardiomyopathies: Secondary | ICD-10-CM | POA: Diagnosis not present

## 2018-07-09 DIAGNOSIS — I472 Ventricular tachycardia: Secondary | ICD-10-CM

## 2018-07-09 DIAGNOSIS — I5022 Chronic systolic (congestive) heart failure: Secondary | ICD-10-CM

## 2018-07-09 DIAGNOSIS — I739 Peripheral vascular disease, unspecified: Secondary | ICD-10-CM

## 2018-07-09 DIAGNOSIS — Z95810 Presence of automatic (implantable) cardiac defibrillator: Secondary | ICD-10-CM

## 2018-07-09 DIAGNOSIS — I10 Essential (primary) hypertension: Secondary | ICD-10-CM

## 2018-07-09 DIAGNOSIS — I4729 Other ventricular tachycardia: Secondary | ICD-10-CM

## 2018-07-09 NOTE — Patient Instructions (Signed)
Our office will call back tomorrow or Monday to verify medication list By her account she is not taking hydralazine 3 times a day perhaps once a day Unclear if she is on isosorbide or spironolactone She would like to have her bottles in front of her make a list then she will update our list   Medication Instructions:  No changes  If you need a refill on your cardiac medications before your next appointment, please call your pharmacy.    Lab work: No new labs needed   If you have labs (blood work) drawn today and your tests are completely normal, you will receive your results only by: Marland Kitchen MyChart Message (if you have MyChart) OR . A paper copy in the mail If you have any lab test that is abnormal or we need to change your treatment, we will call you to review the results.   Testing/Procedures: No new testing needed   Follow-Up: At [facility], you and your health needs are our priority.  As part of our continuing mission to provide you with exceptional heart care, we have created designated Provider Care Teams.  These Care Teams include your primary Cardiologist (physician) and Advanced Practice Providers (APPs -  Physician Assistants and Nurse Practitioners) who all work together to provide you with the care you need, when you need it.  . You will need a follow up appointment in 12 months .   Please call our office 2 months in advance to schedule this appointment.    . Providers on your designated Care Team:   . Murray Hodgkins, NP . Christell Faith, PA-C . Marrianne Mood, PA-C  Any Other Special Instructions Will Be Listed Below (If Applicable).  For educational health videos Log in to : [email or web address] Or : [email or web address], password : [email or web address]

## 2018-07-10 ENCOUNTER — Encounter: Payer: Self-pay | Admitting: Cardiology

## 2018-07-10 NOTE — Progress Notes (Signed)
Remote ICD transmission.   

## 2018-07-15 ENCOUNTER — Telehealth: Payer: Self-pay

## 2018-07-15 NOTE — Telephone Encounter (Signed)
I have faxed a request to patients PCP to get an updated medication list.

## 2018-07-15 NOTE — Telephone Encounter (Signed)
-----   Message from Valora Corporal, RN sent at 07/14/2018  3:18 PM EDT ----- Regarding: FW: Meds This is one of them. ----- Message ----- From: Valora Corporal, RN Sent: 07/09/2018   4:43 PM EDT To: Valora Corporal, RN Subject: Meds                                           Call patient back to check meds

## 2018-07-16 NOTE — Telephone Encounter (Signed)
Called patient.  No answer. No VM.  Need to verify and update patients medication list.

## 2018-07-20 NOTE — Telephone Encounter (Signed)
Called patient.  No answer. VM was full.  Received updated medication list from PCP.  Need to confirm medication.

## 2018-07-21 NOTE — Progress Notes (Signed)
Medication list has been updated based on PCP most recent office visit.  Attempted to reach patient multiple times with no answer or return call.

## 2018-07-21 NOTE — Telephone Encounter (Signed)
Medication list updated from patients most recent office visit with PCP.  Attempted to call patient multiple times to review this information with no answer or return call.

## 2018-09-30 ENCOUNTER — Encounter: Payer: PRIVATE HEALTH INSURANCE | Admitting: *Deleted

## 2018-10-09 ENCOUNTER — Ambulatory Visit (INDEPENDENT_AMBULATORY_CARE_PROVIDER_SITE_OTHER): Payer: PRIVATE HEALTH INSURANCE | Admitting: *Deleted

## 2018-10-09 DIAGNOSIS — I429 Cardiomyopathy, unspecified: Secondary | ICD-10-CM

## 2018-10-09 DIAGNOSIS — I509 Heart failure, unspecified: Secondary | ICD-10-CM

## 2018-10-09 DIAGNOSIS — I428 Other cardiomyopathies: Secondary | ICD-10-CM | POA: Diagnosis not present

## 2018-10-11 LAB — CUP PACEART REMOTE DEVICE CHECK
Date Time Interrogation Session: 20200913173143
Implantable Lead Implant Date: 20180523
Implantable Lead Location: 753860
Implantable Lead Model: 293
Implantable Lead Serial Number: 430811
Implantable Pulse Generator Implant Date: 20180523
Pulse Gen Serial Number: 225390

## 2018-10-16 NOTE — Progress Notes (Signed)
Remote ICD transmission.   

## 2019-01-08 ENCOUNTER — Ambulatory Visit (INDEPENDENT_AMBULATORY_CARE_PROVIDER_SITE_OTHER): Payer: PRIVATE HEALTH INSURANCE | Admitting: *Deleted

## 2019-01-08 DIAGNOSIS — I5022 Chronic systolic (congestive) heart failure: Secondary | ICD-10-CM | POA: Diagnosis not present

## 2019-01-11 LAB — CUP PACEART REMOTE DEVICE CHECK
Date Time Interrogation Session: 20201214061501
Implantable Lead Implant Date: 20180523
Implantable Lead Location: 753860
Implantable Lead Model: 293
Implantable Lead Serial Number: 430811
Implantable Pulse Generator Implant Date: 20180523
Pulse Gen Serial Number: 225390

## 2019-03-02 ENCOUNTER — Other Ambulatory Visit: Payer: Self-pay | Admitting: *Deleted

## 2019-03-02 MED ORDER — AMIODARONE HCL 200 MG PO TABS: 200.0000 mg | ORAL_TABLET | Freq: Every day | ORAL | 4 refills | Status: DC

## 2019-03-02 MED ORDER — SPIRONOLACTONE 25 MG PO TABS: 25.0000 mg | ORAL_TABLET | Freq: Every day | ORAL | 0 refills | Status: DC

## 2019-04-23 ENCOUNTER — Ambulatory Visit (INDEPENDENT_AMBULATORY_CARE_PROVIDER_SITE_OTHER): Payer: PRIVATE HEALTH INSURANCE | Admitting: *Deleted

## 2019-04-23 DIAGNOSIS — I509 Heart failure, unspecified: Secondary | ICD-10-CM

## 2019-04-23 LAB — CUP PACEART REMOTE DEVICE CHECK
Battery Remaining Longevity: 174 mo
Battery Remaining Percentage: 100 %
Brady Statistic RV Percent Paced: 0 %
Date Time Interrogation Session: 20210326004100
HighPow Impedance: 81 Ohm
Implantable Lead Implant Date: 20180523
Implantable Lead Location: 753860
Implantable Lead Model: 293
Implantable Lead Serial Number: 430811
Implantable Pulse Generator Implant Date: 20180523
Lead Channel Impedance Value: 731 Ohm
Lead Channel Setting Pacing Amplitude: 2.5 V
Lead Channel Setting Pacing Pulse Width: 0.4 ms
Lead Channel Setting Sensing Sensitivity: 0.5 mV
Pulse Gen Serial Number: 225390

## 2019-04-24 NOTE — Progress Notes (Signed)
ICD remote 

## 2019-04-30 ENCOUNTER — Other Ambulatory Visit: Payer: Self-pay

## 2019-04-30 ENCOUNTER — Telehealth: Payer: Self-pay

## 2019-04-30 MED ORDER — CARVEDILOL 6.25 MG PO TABS: ORAL_TABLET | ORAL | 3 refills | Status: DC

## 2019-04-30 MED ORDER — TORSEMIDE 20 MG PO TABS: 20.0000 mg | ORAL_TABLET | Freq: Every day | ORAL | 3 refills | Status: DC

## 2019-04-30 NOTE — Telephone Encounter (Signed)
Incoming fax from [facility] -- patient is needing refills on ISOSORBIDE 30 MG Daily and HYDRALAZINE 10MG  TID.   Per patients chart -- "Some medication confusion We will have to call her back to get accurate medication list She is not taking hydralazine on a consistent basis Unclear if she is taking spironolactone or isosorbide, we will call her back when she has her pill bottles in front of her and update her medication list"  Patient stated that she is currently taking these two medication. They are not currently on her medication list.   Just wanted to verify if it was okay to add these two medication on her list and refill them.

## 2019-04-30 NOTE — Telephone Encounter (Signed)
To Dr. Gollan to review.  

## 2019-05-01 NOTE — Telephone Encounter (Signed)
Okay to refill the medications as it would seem she is taking them appropriately

## 2019-05-03 MED ORDER — POTASSIUM CHLORIDE CRYS ER 20 MEQ PO TBCR: 20.0000 meq | EXTENDED_RELEASE_TABLET | Freq: Two times a day (BID) | ORAL | 3 refills | Status: DC

## 2019-05-03 MED ORDER — AMIODARONE HCL 200 MG PO TABS: 200.0000 mg | ORAL_TABLET | Freq: Every day | ORAL | 3 refills | Status: DC

## 2019-05-03 MED ORDER — SPIRONOLACTONE 25 MG PO TABS: 25.0000 mg | ORAL_TABLET | Freq: Every day | ORAL | 3 refills | Status: DC

## 2019-05-03 MED ORDER — HYDRALAZINE HCL 10 MG PO TABS: 10.0000 mg | ORAL_TABLET | Freq: Three times a day (TID) | ORAL | 3 refills | Status: DC

## 2019-05-03 MED ORDER — CARVEDILOL 6.25 MG PO TABS: ORAL_TABLET | ORAL | 3 refills | Status: DC

## 2019-05-03 MED ORDER — ISOSORBIDE MONONITRATE ER 30 MG PO TB24: 30.0000 mg | ORAL_TABLET | Freq: Every day | ORAL | 3 refills | Status: DC

## 2019-05-03 MED ORDER — TORSEMIDE 20 MG PO TABS: 20.0000 mg | ORAL_TABLET | Freq: Every day | ORAL | 3 refills | Status: DC

## 2019-05-03 NOTE — Telephone Encounter (Signed)
Spoke with patient and reviewed all medications. She confirmed each medication and she needed refills on all of them. Confirmed medications, dosages, and frequency. Reviewed chart and sent in all refills to include the isosorbide and hydralazine. She was appreciative for the call back with no further questions at this time.

## 2019-05-03 NOTE — Addendum Note (Signed)
Addended by: Bryna Colander on: 05/03/2019 11:24 AM   Modules accepted: Orders

## 2019-07-09 ENCOUNTER — Telehealth: Payer: Self-pay

## 2019-07-09 ENCOUNTER — Ambulatory Visit (INDEPENDENT_AMBULATORY_CARE_PROVIDER_SITE_OTHER): Payer: PRIVATE HEALTH INSURANCE | Admitting: *Deleted

## 2019-07-09 DIAGNOSIS — I428 Other cardiomyopathies: Secondary | ICD-10-CM

## 2019-07-09 DIAGNOSIS — I5022 Chronic systolic (congestive) heart failure: Secondary | ICD-10-CM

## 2019-07-09 NOTE — Telephone Encounter (Signed)
Unable to speak  with patient to remind of missed remote transmission 

## 2019-07-13 LAB — CUP PACEART REMOTE DEVICE CHECK
Battery Remaining Longevity: 168 mo
Battery Remaining Percentage: 100 %
Brady Statistic RV Percent Paced: 0 %
Date Time Interrogation Session: 20210611004000
HighPow Impedance: 76 Ohm
Implantable Lead Implant Date: 20180523
Implantable Lead Location: 753860
Implantable Lead Model: 293
Implantable Lead Serial Number: 430811
Implantable Pulse Generator Implant Date: 20180523
Lead Channel Impedance Value: 764 Ohm
Lead Channel Setting Pacing Amplitude: 2.5 V
Lead Channel Setting Pacing Pulse Width: 0.4 ms
Lead Channel Setting Sensing Sensitivity: 0.5 mV
Pulse Gen Serial Number: 225390

## 2019-07-15 NOTE — Addendum Note (Signed)
Addended by: Geralyn Flash D on: 07/15/2019 03:22 PM   Modules accepted: Level of Service

## 2019-07-15 NOTE — Progress Notes (Signed)
Remote ICD transmission.   

## 2019-10-08 ENCOUNTER — Ambulatory Visit (INDEPENDENT_AMBULATORY_CARE_PROVIDER_SITE_OTHER): Payer: PRIVATE HEALTH INSURANCE | Admitting: *Deleted

## 2019-10-08 DIAGNOSIS — I509 Heart failure, unspecified: Secondary | ICD-10-CM

## 2019-10-08 LAB — CUP PACEART REMOTE DEVICE CHECK
Battery Remaining Longevity: 174 mo
Battery Remaining Percentage: 100 %
Brady Statistic RV Percent Paced: 0 %
Date Time Interrogation Session: 20210910004100
HighPow Impedance: 89 Ohm
Implantable Lead Implant Date: 20180523
Implantable Lead Location: 753860
Implantable Lead Model: 293
Implantable Lead Serial Number: 430811
Implantable Pulse Generator Implant Date: 20180523
Lead Channel Impedance Value: 767 Ohm
Lead Channel Setting Pacing Amplitude: 2.5 V
Lead Channel Setting Pacing Pulse Width: 0.4 ms
Lead Channel Setting Sensing Sensitivity: 0.5 mV
Pulse Gen Serial Number: 225390

## 2019-10-11 NOTE — Progress Notes (Signed)
Remote ICD transmission.   

## 2019-10-25 ENCOUNTER — Encounter: Payer: Self-pay | Admitting: Family

## 2019-10-25 ENCOUNTER — Other Ambulatory Visit: Payer: Self-pay

## 2019-10-25 ENCOUNTER — Ambulatory Visit: Payer: Medicare HMO | Admitting: Family

## 2019-10-25 VITALS — BP 112/80 | HR 78 | Ht 66.0 in | Wt 226.0 lb

## 2019-10-25 DIAGNOSIS — I428 Other cardiomyopathies: Secondary | ICD-10-CM | POA: Diagnosis not present

## 2019-10-25 DIAGNOSIS — I5022 Chronic systolic (congestive) heart failure: Secondary | ICD-10-CM | POA: Diagnosis not present

## 2019-10-25 DIAGNOSIS — I493 Ventricular premature depolarization: Secondary | ICD-10-CM

## 2019-10-25 DIAGNOSIS — I4729 Other ventricular tachycardia: Secondary | ICD-10-CM

## 2019-10-25 DIAGNOSIS — Z95810 Presence of automatic (implantable) cardiac defibrillator: Secondary | ICD-10-CM | POA: Diagnosis not present

## 2019-10-25 DIAGNOSIS — I472 Ventricular tachycardia: Secondary | ICD-10-CM

## 2019-10-25 DIAGNOSIS — Z79899 Other long term (current) drug therapy: Secondary | ICD-10-CM

## 2019-10-25 MED ORDER — POTASSIUM CHLORIDE CRYS ER 20 MEQ PO TBCR: 20.0000 meq | EXTENDED_RELEASE_TABLET | Freq: Two times a day (BID) | ORAL | 5 refills | Status: DC

## 2019-10-25 MED ORDER — SPIRONOLACTONE 25 MG PO TABS: 25.0000 mg | ORAL_TABLET | Freq: Every day | ORAL | 5 refills | Status: DC

## 2019-10-25 MED ORDER — TORSEMIDE 20 MG PO TABS: 20.0000 mg | ORAL_TABLET | Freq: Every day | ORAL | 5 refills | Status: DC

## 2019-10-25 MED ORDER — AMIODARONE HCL 200 MG PO TABS: 200.0000 mg | ORAL_TABLET | Freq: Every day | ORAL | 5 refills | Status: DC

## 2019-10-25 MED ORDER — ISOSORBIDE MONONITRATE ER 30 MG PO TB24: 30.0000 mg | ORAL_TABLET | Freq: Every day | ORAL | 5 refills | Status: DC

## 2019-10-25 MED ORDER — CARVEDILOL 6.25 MG PO TABS: 6.2500 mg | ORAL_TABLET | Freq: Two times a day (BID) | ORAL | 5 refills | Status: DC

## 2019-10-25 MED ORDER — HYDRALAZINE HCL 10 MG PO TABS: 10.0000 mg | ORAL_TABLET | Freq: Three times a day (TID) | ORAL | 5 refills | Status: DC

## 2019-10-25 NOTE — Patient Instructions (Addendum)
Medication Instructions:  No medication changes today.  *If you need a refill on your cardiac medications before your next appointment, please call your pharmacy*  Lab Work: Your physician recommends that you return for lab work in: CMP, CBC, TSH  If you have labs (blood work) drawn today and your tests are completely normal, you will receive your results only by: Marland Kitchen MyChart Message (if you have MyChart) OR . A paper copy in the mail If you have any lab test that is abnormal or we need to change your treatment, we will call you to review the results.  Testing/Procedures: Your EKG today was stable compared to previous.   Follow-Up: At [facility], you and your health needs are our priority.  As part of our continuing mission to provide you with exceptional heart care, we have created designated Provider Care Teams.  These Care Teams include your primary Cardiologist (physician) and Advanced Practice Providers (APPs -  Physician Assistants and Nurse Practitioners) who all work together to provide you with the care you need, when you need it.  We recommend signing up for the patient portal called "MyChart".  Sign up information is provided on this After Visit Summary.  MyChart is used to connect with patients for Virtual Visits (Telemedicine).  Patients are able to view lab/test results, encounter notes, upcoming appointments, etc.  Non-urgent messages can be sent to your provider as well.   To learn more about what you can do with MyChart, go to [email or web address].    Your next appointment:   In January with Dr. Graciela Husbands as scheduled  AND  6 month(s) in person with Dr. Mariah Milling  Other Instructions  Recommend regular walking regimen.   Recommend you continue your low salt diet.   Continue to drink less than 2 liters of fluid per day - this helps prevent you from holding onto extra fluid.   Recommend taking your medication regularly to prevent hanging onto fluid.    Heart  Failure Eating Plan Heart failure, also called congestive heart failure, occurs when your heart does not pump blood well enough to meet your body's needs for oxygen-rich blood. Heart failure is a long-term (chronic) condition. Living with heart failure can be challenging. However, following your health care provider's instructions about a healthy lifestyle and working with a diet and nutrition specialist (dietitian) to choose the right foods may help to improve your symptoms. What are tips for following this plan? Reading food labels  Check food labels for the amount of sodium per serving. Choose foods that have less than 140 mg (milligrams) of sodium in each serving.  Check food labels for the number of calories per serving. This is important if you need to limit your daily calorie intake to lose weight.  Check food labels for the serving size. If you eat more than one serving, you will be eating more sodium and calories than what is listed on the label.  Look for foods that are labeled as "sodium-free," "very low sodium," or "low sodium." ? Foods labeled as "reduced sodium" or "lightly salted" may still have more sodium than what is recommended for you. Cooking  Avoid adding salt when cooking. Ask your health care provider or dietitian before using salt substitutes.  Season food with salt-free seasonings, spices, or herbs. Check the label of seasoning mixes to make sure they do not contain salt.  Cook with heart-healthy oils, such as olive, canola, soybean, or sunflower oil.  Do not fry foods. Cook foods using  low-fat methods, such as baking, boiling, grilling, and broiling.  Limit unhealthy fats when cooking by: ? Removing the skin from poultry, such as chicken. ? Removing all visible fats from meats. ? Skimming the fat off from stews, soups, and gravies before serving them. Meal planning   Limit your intake of: ? Processed, canned, or pre-packaged foods. ? Foods that are high in  trans fat, such as fried foods. ? Sweets, desserts, sugary drinks, and other foods with added sugar. ? Full-fat dairy products, such as whole milk.  Eat a balanced diet that includes: ? 4-5 servings of fruit each day and 4-5 servings of vegetables each day. At each meal, try to fill half of your plate with fruits and vegetables. ? Up to 6-8 servings of whole grains each day. ? Up to 2 servings of lean meat, poultry, or fish each day. One serving of meat is equal to 3 oz. This is about the same size as a deck of cards. ? 2 servings of low-fat dairy each day. ? Heart-healthy fats. Healthy fats called omega-3 fatty acids are found in foods such as flaxseed and cold-water fish like sardines, salmon, and mackerel.  Aim to eat 25-35 g (grams) of fiber a day. Foods that are high in fiber include apples, broccoli, carrots, beans, peas, and whole grains.  Do not add salt or condiments that contain salt (such as soy sauce) to foods before eating.  When eating at a restaurant, ask that your food be prepared with less salt or no salt, if possible.  Try to eat 2 or more vegetarian meals each week.  Eat more home-cooked food and eat less restaurant, buffet, and fast food. General information  Do not eat more than 2,300 mg of salt (sodium) a day. The amount of sodium that is recommended for you may be lower, depending on your condition.  Maintain a healthy body weight as directed. Ask your health care provider what a healthy weight is for you. ? Check your weight every day. ? Work with your health care provider and dietitian to make a plan that is right for you to lose weight or maintain your current weight.  Limit how much fluid you drink. Ask your health care provider or dietitian how much fluid you can have each day.  Limit or avoid alcohol as told by your health care provider or dietitian. Recommended foods The items listed may not be a complete list. Talk with your dietitian about what dietary  choices are best for you. Fruits All fresh, frozen, and canned fruits. Dried fruits, such as raisins, prunes, and cranberries. Vegetables All fresh vegetables. Vegetables that are frozen without sauce or added salt. Low-sodium or sodium-free canned vegetables. Grains Bread with less than 80 mg of sodium per slice. Whole-wheat pasta, quinoa, and brown rice. Oats and oatmeal. Barley. Millet. Grits and cream of wheat. Whole-grain and whole-wheat cold cereal. Meats and other protein foods Lean cuts of meat. Skinless chicken and [place]. Fish with high omega-3 fatty acids, such as salmon, sardines, and other cold-water fishes. Eggs. Dried beans, peas, and edamame. Unsalted nuts and nut butters. Dairy Low-fat or nonfat (skim) milk and dried milk. Rice milk, soy milk, and almond milk. Low-fat or nonfat yogurt. Small amounts of reduced-sodium block cheese. Low-sodium cottage cheese. Fats and oils Olive, canola, soybean, flaxseed, or sunflower oil. Avocado. Sweets and desserts Apple sauce. Granola bars. Sugar-free pudding and gelatin. Frozen fruit bars. Seasoning and other foods Fresh and dried herbs. Lemon or lime  juice. Vinegar. Low-sodium ketchup. Salt-free marinades, salad dressings, sauces, and seasonings. The items listed above may not be a complete list of foods and beverages you can eat. Contact a dietitian for more information. Foods to avoid The items listed may not be a complete list. Talk with your dietitian about what dietary choices are best for you. Fruits Fruits that are dried with sodium-containing preservatives. Vegetables Canned vegetables. Frozen vegetables with sauce or seasonings. Creamed vegetables. [place] fries. Onion rings. Pickled vegetables and sauerkraut. Grains Bread with more than 80 mg of sodium per slice. Hot or cold cereal with more than 140 mg sodium per serving. Salted pretzels and crackers. Pre-packaged breadcrumbs. Bagels, croissants, and biscuits. Meats and other  protein foods Ribs and chicken wings. Bacon, ham, pepperoni, bologna, salami, and packaged luncheon meats. Hot dogs, bratwurst, and sausage. Canned meat. Smoked meat and fish. Salted nuts and seeds. Dairy Whole milk, half-and-half, and cream. Buttermilk. Processed cheese, cheese spreads, and cheese curds. Regular cottage cheese. Feta cheese. Shredded cheese. String cheese. Fats and oils Butter, lard, shortening, ghee, and bacon fat. Canned and packaged gravies. Seasoning and other foods Onion salt, garlic salt, table salt, and sea salt. Marinades. Regular salad dressings. Relishes, pickles, and olives. Meat flavorings and tenderizers, and bouillon cubes. Horseradish, ketchup, and mustard. Worcestershire sauce. Teriyaki sauce, soy sauce (including reduced sodium). Hot sauce and Tabasco sauce. Steak sauce, fish sauce, oyster sauce, and cocktail sauce. Taco seasonings. Barbecue sauce. Tartar sauce. The items listed above may not be a complete list of foods and beverages you should avoid. Contact a dietitian for more information. Summary  A heart failure eating plan includes changes that limit your intake of sodium and unhealthy fat, and it may help you lose weight or maintain a healthy weight. Your health care provider may also recommend limiting how much fluid you drink.  Most people with heart failure should eat no more than 2,300 mg of salt (sodium) a day. The amount of sodium that is recommended for you may be lower, depending on your condition.  Contact your health care provider or dietitian before making any major changes to your diet. This information is not intended to replace advice given to you by your health care provider. Make sure you discuss any questions you have with your health care provider. Document Revised: 03/12/2018 Document Reviewed: 05/31/2016 Elsevier Patient Education  2020 [employer].

## 2019-10-25 NOTE — Progress Notes (Signed)
Office Visit    Patient Name: Charlene Schmitt Date of Encounter: 10/25/2019  Primary Care Provider:  Dione Housekeeper, MD Primary Cardiologist:  Julien Nordmann, MD Electrophysiologist:  Sherryl Manges, MD   Chief Complaint    Charlene Schmitt is a 64 y.o. female with a hx of obesity, HTN, nonischemic cardiomyopathy s/p ICD (2018), NSVT, PVC, tobacco use presents today for follow-up of cardiomyopathy  Past Medical History    Past Medical History:  Diagnosis Date  . AICD (automatic cardioverter/defibrillator) present   . Arthritis    "some; treated w/ibuprofen" (06/19/2016)  . Chronic kidney disease   . Chronic lower back pain   . Chronic systolic CHF (congestive heart failure) (HCC)    a. 10/2011 Cath: nl cors, EF 15-20%;  b. 02/2012 Echo: EF 25-30%; c. 02/2016 Echo: EF 20-25%, diff HK, mild MR, mildly dil LA.  Marland Kitchen COPD (chronic obstructive pulmonary disease) (HCC)   . 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 Hypertension   . Morbid obesity (HCC)   . NICM (nonischemic cardiomyopathy) (HCC)    a. 10/2011 Cath: nl cors, EF 15-20%;  b. 02/2012 Echo: EF 25-30%; c. 02/2016 Echo: EF 20-25%; d. 05/2016 s/p BSX Vigilant EL single lead AICD.  Marland Kitchen PVC's (premature ventricular contractions)    a. 01/2016 Holter: 22% PVC's w/ multiple morphologies with runs.  . Tobacco abuse    Past Surgical History:  Procedure Laterality Date  . CARDIAC CATHETERIZATION  11/19/2011  . GANGLION CYST EXCISION Right   . ICD IMPLANT N/A 06/19/2016   Procedure: ICD Implant;  Surgeon: Duke Salvia, MD;  Location: [facility] INVASIVE CV LAB;  Service: Cardiovascular;  Laterality: N/A;  . LACERATION REPAIR     finger; s/p stitches  . TOOTH EXTRACTION     x2    Allergies  Allergies  Allergen Reactions  . Ace Inhibitors Other (See Comments) and Cough    Cough Cough Cough Cough   . Losartan     rash  . Penicillins Rash    History of Present Illness    Charlene Schmitt is a 64  y.o. female with a hx of obesity, HTN, nonischemic cardiomyopathy s/p ICD, NSVT, PVC, tobacco use last seen 06/2018 via telemedicine by Dr. Mariah Milling.   Previous cardiac catheterization in 2013 with normal coronary arteries.  Previous cardiac monitoring 2018 with 22% PVC burden.  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  She is s/p AICD May 2018.  She has been maintained on amiodarone.  When seen in clinic 01/2018 she noted rash on Losartan which resolved medication was discontinued.  She was instead started on hydralazine and Imdur.  Reports today for follow up. BP intermittently checked at home with readings 120-130/60-70. Stays busy watching her great grandkids. No formal exercise routine. Endorses trying to eat a heart healthy diet and does all of her own cooking. Does sometimes miss doses of her medications, discussed the importance of taking her medications regularly.   Reports no shortness of breath at rest. Endorses stable dyspnea on exertion. Reports no chest pain, pressure, or tightness. No edema, orthopnea, PND. Reports no palpitations.  EKGs/Labs/Other Studies Reviewed:   The following studies were reviewed today:  Ultrasound ABI 05/18/2018 Summary:  Right: Resting right ankle-brachial index is within normal range. No  evidence of significant right lower extremity arterial disease. The right  toe-brachial index is normal.  Left: Resting left ankle-brachial index is within normal range. No  evidence of significant left lower extremity arterial disease. The left  toe-brachial index is normal.   Echo 04/2016 Left ventricle: The cavity size was moderately dilated. Wall    thickness was increased in a pattern of mild LVH. Systolic    function was severely reduced. The estimated ejection fraction    was in the range of 25% to 30%.  Diffuse hypokinesis. Regional    wall motion abnormalities cannot be excluded. Doppler parameters    are consistent with abnormal left ventricular relaxation (grade 1    diastolic dysfunction).  - Left atrium: The atrium was mildly dilated.  - Right ventricle: The cavity size was normal. Systolic function    was normal.  - Right atrium: The atrium was mildly dilated.  - Atrial septum: There was increased thickness of the septum,    consistent with lipomatous hypertrophy.   EKG:  EKG is  ordered today.  The ekg ordered today demonstrates NSR 78 bpm with LVH and stable lateral TWI. No acute ST/T wave changes.   Recent Labs: No results found for requested labs within last 8760 hours.  Recent Lipid Panel    Component Value Date/Time   CHOL 172 11/18/2011 0455   TRIG 89 11/18/2011 0455   HDL 74 (H) 11/18/2011 0455   VLDL 18 11/18/2011 0455   LDLCALC 80 11/18/2011 0455    Home Medications   Current Meds  Medication Sig  . amiodarone (PACERONE) 200 MG tablet Take 1 tablet (200 mg total) by mouth daily.  Marland Kitchen aspirin 81 MG tablet Take 81 mg by mouth daily.  . carvedilol (COREG) 6.25 MG tablet TAKE 1 TABLET (6.25 MG TOTAL) BY MOUTH 2 (TWO) TIMES DAILY WITH A MEAL.  . diphenhydrAMINE (BENADRYL) 25 mg capsule Take 25 mg by mouth daily.  . Fluticasone-Salmeterol (ADVAIR DISKUS) 250-50 MCG/DOSE AEPB Inhale 1 puff into the lungs as needed (for shortness of breath).   . hydrALAZINE (APRESOLINE) 10 MG tablet Take 1 tablet (10 mg total) by mouth 3 (three) times daily.  Marland Kitchen ibuprofen (ADVIL,MOTRIN) 200 MG tablet Take 400 mg by mouth every 6 (six) hours as needed for headache or moderate pain.   Marland Kitchen ipratropium (ATROVENT) 0.06 % nasal spray Place 2 sprays into both nostrils 3 (three) times daily.  Marland Kitchen levocetirizine (XYZAL) 5 MG tablet Take 5 mg by mouth every evening.  . potassium chloride SA (KLOR-CON) 20 MEQ tablet Take 1 tablet (20 mEq total) by mouth 2 (two) times daily.  Marland Kitchen spironolactone (ALDACTONE)  25 MG tablet Take 1 tablet (25 mg total) by mouth daily.  Marland Kitchen torsemide (DEMADEX) 20 MG tablet Take 1 tablet (20 mg total) by mouth daily.     Review of Systems     Review of Systems  Constitutional: Negative for chills, fever and malaise/fatigue.  Cardiovascular: Positive for dyspnea on exertion. Negative for chest pain, irregular heartbeat, leg swelling, near-syncope, orthopnea, palpitations and syncope.  Respiratory: Negative for cough, shortness of breath and wheezing.   Gastrointestinal: Negative for melena, nausea and vomiting.  Genitourinary: Negative for hematuria.  Neurological: Negative for dizziness, light-headedness and weakness.   All other systems reviewed and are otherwise negative except as noted above.  Physical Exam    VS:  BP 112/80 (BP Location: Left Arm, Patient Position: Sitting, Cuff Size: Normal)   Pulse 78   Ht 5\' 6"  (1.676 m)   Wt 226 lb (102.5 kg)   SpO2 97%   BMI  36.48 kg/m  , BMI Body mass index is 36.48 kg/m. GEN: Well nourished, overweight, well developed, in no acute distress. HEENT: normal. Neck: Supple, no JVD, carotid bruits, or masses. Cardiac: RRR, no murmurs, rubs, or gallops. No clubbing, cyanosis, edema.  Radials/DP/PT 2+ and equal bilaterally.  Respiratory:  Respirations regular and unlabored, clear to auscultation bilaterally. GI: Soft, nontender, nondistended, BS + x 4. MS: No deformity or atrophy. Skin: Warm and dry, no rash. Neuro:  Strength and sensation are intact. Psych: Normal affect.  Assessment & Plan    1. Nonischemic cardiomyopathy/chronic systolic heart failure- Euvolemic and well compensated on exam. NYHA II with DOE. Present GDMT includes Coreg, Hydralazine, imdur, Spironolactone, Torsemide, Potassium. She does endorse missing doses of medications and the importance of taking her medications regularly was discussed. Low salt, heart healthy diet encouraged.   2. NSVT/PVC- No evidence of recurrence. Continue Amiodarone 200mg   daily. No signs of toxicity. Amiodarone monitoring labs today including CMP, TSH. Annual eye visit encouraged.   3. S/p ICD- Continue to follow with Dr. .   4. HTN- BP well controlled. Continue current antihypertensive regimen.   Disposition: Follow up in January with Dr. February and in 6 month(s) with Dr. Graciela Husbands or APP   Mariah Milling, NP 10/25/2019, 10:31 AM

## 2019-10-26 LAB — CBC
Hematocrit: 40.1 % (ref 34.0–46.6)
Hemoglobin: 13.5 g/dL (ref 11.1–15.9)
MCH: 30.4 pg (ref 26.6–33.0)
MCHC: 33.7 g/dL (ref 31.5–35.7)
MCV: 90 fL (ref 79–97)
Platelets: 243 10*3/uL (ref 150–450)
RBC: 4.44 x10E6/uL (ref 3.77–5.28)
RDW: 13.6 % (ref 11.7–15.4)
WBC: 3.1 10*3/uL — ABNORMAL LOW (ref 3.4–10.8)

## 2019-10-26 LAB — COMPREHENSIVE METABOLIC PANEL
ALT: 16 IU/L (ref 0–32)
AST: 17 IU/L (ref 0–40)
Albumin/Globulin Ratio: 1.5 (ref 1.2–2.2)
Albumin: 3.9 g/dL (ref 3.8–4.8)
Alkaline Phosphatase: 85 IU/L (ref 44–121)
BUN/Creatinine Ratio: 13 (ref 12–28)
BUN: 18 mg/dL (ref 8–27)
Bilirubin Total: 0.4 mg/dL (ref 0.0–1.2)
CO2: 24 mmol/L (ref 20–29)
Calcium: 9.8 mg/dL (ref 8.7–10.3)
Chloride: 106 mmol/L (ref 96–106)
Creatinine, Ser: 1.34 mg/dL — ABNORMAL HIGH (ref 0.57–1.00)
GFR calc Af Amer: 48 mL/min/{1.73_m2} — ABNORMAL LOW (ref >59)
GFR calc non Af Amer: 42 mL/min/{1.73_m2} — ABNORMAL LOW (ref >59)
Globulin, Total: 2.6 g/dL (ref 1.5–4.5)
Glucose: 103 mg/dL — ABNORMAL HIGH (ref 65–99)
Potassium: 4.7 mmol/L (ref 3.5–5.2)
Sodium: 142 mmol/L (ref 134–144)
Total Protein: 6.5 g/dL (ref 6.0–8.5)

## 2019-10-26 LAB — TSH: TSH: 0.614 u[IU]/mL (ref 0.450–4.500)

## 2020-01-07 ENCOUNTER — Ambulatory Visit (INDEPENDENT_AMBULATORY_CARE_PROVIDER_SITE_OTHER): Payer: Medicare HMO

## 2020-01-07 DIAGNOSIS — I428 Other cardiomyopathies: Secondary | ICD-10-CM

## 2020-01-07 LAB — CUP PACEART REMOTE DEVICE CHECK
Battery Remaining Longevity: 168 mo
Battery Remaining Percentage: 100 %
Brady Statistic RV Percent Paced: 0 %
Date Time Interrogation Session: 20211210004000
HighPow Impedance: 79 Ohm
Implantable Lead Implant Date: 20180523
Implantable Lead Location: 753860
Implantable Lead Model: 293
Implantable Lead Serial Number: 430811
Implantable Pulse Generator Implant Date: 20180523
Lead Channel Impedance Value: 735 Ohm
Lead Channel Setting Pacing Amplitude: 2.5 V
Lead Channel Setting Pacing Pulse Width: 0.4 ms
Lead Channel Setting Sensing Sensitivity: 0.5 mV
Pulse Gen Serial Number: 225390

## 2020-01-20 NOTE — Progress Notes (Signed)
Remote ICD transmission.   

## 2020-02-08 ENCOUNTER — Encounter: Payer: Self-pay | Admitting: Internal Medicine

## 2020-04-07 ENCOUNTER — Ambulatory Visit (INDEPENDENT_AMBULATORY_CARE_PROVIDER_SITE_OTHER): Payer: Medicare HMO

## 2020-04-07 DIAGNOSIS — I428 Other cardiomyopathies: Secondary | ICD-10-CM | POA: Diagnosis not present

## 2020-04-10 LAB — CUP PACEART REMOTE DEVICE CHECK
Battery Remaining Longevity: 162 mo
Battery Remaining Percentage: 100 %
Brady Statistic RV Percent Paced: 0 %
Date Time Interrogation Session: 20220311004100
HighPow Impedance: 81 Ohm
Implantable Lead Implant Date: 20180523
Implantable Lead Location: 753860
Implantable Lead Model: 293
Implantable Lead Serial Number: 430811
Implantable Pulse Generator Implant Date: 20180523
Lead Channel Impedance Value: 772 Ohm
Lead Channel Setting Pacing Amplitude: 2.5 V
Lead Channel Setting Pacing Pulse Width: 0.4 ms
Lead Channel Setting Sensing Sensitivity: 0.5 mV
Pulse Gen Serial Number: 225390

## 2020-04-16 NOTE — Progress Notes (Signed)
Date:  04/16/2020   ID:  Charlene Schmitt, DOB December 20, 1955, MRN [id number]  Patient Location:  [facility] [place] [address]   Provider location:   [facility], [place] office  PCP:  Dione Housekeeper, MD  Cardiologist:  Hubbard Robinson [facility]   Chief Complaint  Patient presents with  . Follow-up    6 Months follow up. Medications verbally reviewed with patient.      History of Present Illness:    Charlene Schmitt is a 65 y.o. female  past medical history of  obesity,  hypertension,  nonischemic cardiomyopathy,  ejection fraction less than 25%, In 2018 cardiac catheterization showing no significant coronary artery disease, normal right ventricular systolic pressures on echocardiogram.  smoker She presents for routine follow up for her cardiomyopathy.  Feels well, no SOB Still with leg pain, declined cortisone shots Sometimes hip and knee pain  ICD downloads reviewed, no arrhythmia ICD placed in 05/2016  Denies fluid retention Weight 230 pounds last visit 6 months ago Now 228 Torsemide 20 mg  Daily Stable lab work  Continues to [employer]  1/2 ppd, no desire to quit  Rash on losartan, cough on ACE inhibitor On carvedilol, isosorbide, hydralazine, spironolactone  No recent echocardiogram  Retired, Worked at [employer] for many years [employer] daily, active with grandchildren  EKG personally reviewed by myself on todays visit NSR rate 77 bpm pvc nonspecific ST and T  Other past medical history   x-rays showing L2-L5 significant disc disease Previous renal dysfunction in the setting of NSAIDs and diuresis renal ultrasound 05/29/2011 showing no hydronephrosis seen by renal service at [facility]   Prior CV studies:   The following studies were reviewed today:   Past Medical History:  Diagnosis Date  . AICD (automatic cardioverter/defibrillator) present   . Arthritis    "some; treated w/ibuprofen" (06/19/2016)  . Chronic kidney  disease   . Chronic lower back pain   . Chronic systolic CHF (congestive heart failure) (HCC)    a. 10/2011 Cath: nl cors, EF 15-20%;  b. 02/2012 Echo: EF 25-30%; c. 02/2016 Echo: EF 20-25%, diff HK, mild MR, mildly dil LA.  Marland Kitchen COPD (chronic obstructive pulmonary disease) (HCC)   . 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 Hypertension   . Morbid obesity (HCC)   . NICM (nonischemic cardiomyopathy) (HCC)    a. 10/2011 Cath: nl cors, EF 15-20%;  b. 02/2012 Echo: EF 25-30%; c. 02/2016 Echo: EF 20-25%; d. 05/2016 s/p BSX Vigilant EL single lead AICD.  Marland Kitchen PVC's (premature ventricular contractions)    a. 01/2016 Holter: 22% PVC's w/ multiple morphologies with runs.  . Tobacco abuse    Past Surgical History:  Procedure Laterality Date  . CARDIAC CATHETERIZATION  11/19/2011  . GANGLION CYST EXCISION Right   . ICD IMPLANT N/A 06/19/2016   Procedure: ICD Implant;  Surgeon: Duke Salvia, MD;  Location: [facility] INVASIVE CV LAB;  Service: Cardiovascular;  Laterality: N/A;  . LACERATION REPAIR     finger; s/p stitches  . TOOTH EXTRACTION     x2     No outpatient medications have been marked as taking for the 04/18/20 encounter (Appointment) with Antonieta Iba, MD.     Allergies:   Ace inhibitors, Losartan, and Penicillins   Social History   Tobacco Use  . Smoking status: Current Every Day Smoker    Packs/day: 0.50    Years: 48.00    Pack years: 24.00  Types: Cigarettes  . Smokeless tobacco: Never Used  Vaping Use  . Vaping Use: Never used  Substance Use Topics  . Alcohol use: Yes    Comment: 06/19/2016 "nothing in years"  . Drug use: No     Current Outpatient Medications on File Prior to Visit  Medication Sig Dispense Refill  . amiodarone (PACERONE) 200 MG tablet Take 1 tablet (200 mg total) by mouth daily. 30 tablet 5  . aspirin 81 MG tablet Take 81 mg by mouth daily.    . carvedilol (COREG) 6.25 MG tablet Take 1 tablet (6.25 mg total) by mouth 2 (two) times daily  with a meal. TAKE 1 TABLET (6.25 MG TOTAL) BY MOUTH 2 (TWO) TIMES DAILY WITH A MEAL. 60 tablet 5  . diphenhydrAMINE (BENADRYL) 25 mg capsule Take 25 mg by mouth daily.    . Fluticasone-Salmeterol (ADVAIR DISKUS) 250-50 MCG/DOSE AEPB Inhale 1 puff into the lungs as needed (for shortness of breath).     . hydrALAZINE (APRESOLINE) 10 MG tablet Take 1 tablet (10 mg total) by mouth 3 (three) times daily. 90 tablet 5  . ibuprofen (ADVIL,MOTRIN) 200 MG tablet Take 400 mg by mouth every 6 (six) hours as needed for headache or moderate pain.     Marland Kitchen ipratropium (ATROVENT) 0.06 % nasal spray Place 2 sprays into both nostrils 3 (three) times daily.    . isosorbide mononitrate (IMDUR) 30 MG 24 hr tablet Take 1 tablet (30 mg total) by mouth daily. 30 tablet 5  . levocetirizine (XYZAL) 5 MG tablet Take 5 mg by mouth every evening.    . potassium chloride SA (KLOR-CON) 20 MEQ tablet Take 1 tablet (20 mEq total) by mouth 2 (two) times daily. 60 tablet 5  . spironolactone (ALDACTONE) 25 MG tablet Take 1 tablet (25 mg total) by mouth daily. 30 tablet 5  . torsemide (DEMADEX) 20 MG tablet Take 1 tablet (20 mg total) by mouth daily. 30 tablet 5   No current facility-administered medications on file prior to visit.     Family Hx: The patient's family history includes Hypertension in her father and mother.  ROS:   Please see the history of present illness.    Review of Systems  Constitutional: Negative.   Respiratory: Negative.   Cardiovascular: Negative.   Gastrointestinal: Negative.   Musculoskeletal: Negative.   Neurological: Negative.   Psychiatric/Behavioral: Negative.   All other systems reviewed and are negative.    Labs/Other Tests and Data Reviewed:    Recent Labs: 10/25/2019: ALT 16; BUN 18; Creatinine, Ser 1.34; Hemoglobin 13.5; Platelets 243; Potassium 4.7; Sodium 142; TSH 0.614   Recent Lipid Panel Lab Results  Component Value Date/Time   CHOL 172 11/18/2011 04:55 AM   TRIG 89 11/18/2011  04:55 AM   HDL 74 (H) 11/18/2011 04:55 AM   LDLCALC 80 11/18/2011 04:55 AM    Wt Readings from Last 3 Encounters:  10/25/19 226 lb (102.5 kg)  03/17/18 229 lb 8 oz (104.1 kg)  02/12/18 236 lb (107 kg)     Exam:    BP 124/84 (BP Location: Left Arm, Patient Position: Sitting, Cuff Size: Large)   Pulse 77   Ht 5\' 6"  (1.676 m)   Wt 228 lb (103.4 kg)   SpO2 98%   BMI 36.80 kg/m  Constitutional:  oriented to person, place, and time. No distress.  Obese HENT:  Head: Grossly normal Eyes:  no discharge. No scleral icterus.  Neck: No JVD, no carotid bruits  Cardiovascular: Regular  rate and rhythm, no murmurs appreciated Pulmonary/Chest: Clear to auscultation bilaterally, no wheezes or rails Abdominal: Soft.  no distension.  no tenderness.  Musculoskeletal: Normal range of motion Neurological:  normal muscle tone. Coordination normal. No atrophy Skin: Skin warm and dry Psychiatric: normal affect, pleasant   ASSESSMENT & PLAN:    1. Nonischemic cardiomyopathy (HCC) No medication changes made, we did discuss whether to retry ARB, will hold off for now but could consider low-dose losartan or Benicar Was a rash on losartan -Discussed repeat echocardiogram, noncommittal, order not placed Recommend she discuss with Dr. Graciela Husbands, last echo 4 years ago with ejection fraction 25 to 30%  2. NSVT (nonsustained ventricular tachycardia) (HCC) Denies any tachypalpitations, no PVC, Pacer downloads reviewed no arrhythmia documented  3. Chronic systolic heart failure (HCC) Appears relatively euvolemic, no changes to her medications, stable level  4. ICD (implantable cardioverter-defibrillator) in place Device placed May 2018 Follow-up  5. Frequent PVCs Asymptomatic, noted on EKG  6. Claudication (HCC) ABIs negative, just has chronic leg pain Declined cortisone shots in the past  7. Essential hypertension Blood pressure is well controlled on today's visit. No changes made to the  medications. Discussed her medication list in detail Compliant with meds  8. Chronic systolic CHF (congestive heart failure) (HCC) Appears euvolemic    Total encounter time more than 25 minutes  Greater than 50% was spent in counseling and coordination of care with the patient   Signed, Julien Nordmann, MD  04/16/2020 9:34 AM    [facility] [address], [place], [place] [address]

## 2020-04-17 NOTE — Progress Notes (Signed)
Remote ICD transmission.   

## 2020-04-18 ENCOUNTER — Other Ambulatory Visit: Payer: Self-pay

## 2020-04-18 ENCOUNTER — Encounter: Payer: Self-pay | Admitting: Cardiovascular Disease

## 2020-04-18 ENCOUNTER — Ambulatory Visit: Payer: Medicare HMO | Admitting: Cardiovascular Disease

## 2020-04-18 VITALS — BP 124/84 | HR 77 | Ht 66.0 in | Wt 228.0 lb

## 2020-04-18 DIAGNOSIS — I4729 Other ventricular tachycardia: Secondary | ICD-10-CM

## 2020-04-18 DIAGNOSIS — I5022 Chronic systolic (congestive) heart failure: Secondary | ICD-10-CM

## 2020-04-18 DIAGNOSIS — I429 Cardiomyopathy, unspecified: Secondary | ICD-10-CM

## 2020-04-18 DIAGNOSIS — Z95810 Presence of automatic (implantable) cardiac defibrillator: Secondary | ICD-10-CM | POA: Diagnosis not present

## 2020-04-18 DIAGNOSIS — I428 Other cardiomyopathies: Secondary | ICD-10-CM | POA: Diagnosis not present

## 2020-04-18 DIAGNOSIS — I472 Ventricular tachycardia: Secondary | ICD-10-CM | POA: Diagnosis not present

## 2020-04-18 DIAGNOSIS — I509 Heart failure, unspecified: Secondary | ICD-10-CM

## 2020-04-18 DIAGNOSIS — I493 Ventricular premature depolarization: Secondary | ICD-10-CM

## 2020-04-18 NOTE — Patient Instructions (Signed)
Medication Instructions:  No changes  If you need a refill on your cardiac medications before your next appointment, please call your pharmacy.    Lab work: No new labs needed   If you have labs (blood work) drawn today and your tests are completely normal, you will receive your results only by: . MyChart Message (if you have MyChart) OR . A paper copy in the mail If you have any lab test that is abnormal or we need to change your treatment, we will call you to review the results.   Testing/Procedures: No new testing needed   Follow-Up: At [facility], you and your health needs are our priority.  As part of our continuing mission to provide you with exceptional heart care, we have created designated Provider Care Teams.  These Care Teams include your primary Cardiologist (physician) and Advanced Practice Providers (APPs -  Physician Assistants and Nurse Practitioners) who all work together to provide you with the care you need, when you need it.  . You will need a follow up appointment in 6 months  . Providers on your designated Care Team:   . Christopher Berge, NP . Ryan Dunn, PA-C . Jacquelyn Visser, PA-C  Any Other Special Instructions Will Be Listed Below (If Applicable).  COVID-19 Vaccine Information can be found at: [email or web address] For questions related to vaccine distribution or appointments, please email [email or web address] or call [phone].     

## 2020-05-02 ENCOUNTER — Other Ambulatory Visit: Payer: Self-pay

## 2020-05-02 ENCOUNTER — Encounter: Payer: Self-pay | Admitting: Internal Medicine

## 2020-05-02 ENCOUNTER — Ambulatory Visit: Payer: Medicare HMO | Admitting: Internal Medicine

## 2020-05-02 ENCOUNTER — Ambulatory Visit (INDEPENDENT_AMBULATORY_CARE_PROVIDER_SITE_OTHER): Payer: Medicare HMO

## 2020-05-02 VITALS — BP 122/94 | HR 70 | Ht 66.0 in | Wt 234.0 lb

## 2020-05-02 DIAGNOSIS — I472 Ventricular tachycardia: Secondary | ICD-10-CM

## 2020-05-02 DIAGNOSIS — I10 Essential (primary) hypertension: Secondary | ICD-10-CM

## 2020-05-02 DIAGNOSIS — I428 Other cardiomyopathies: Secondary | ICD-10-CM | POA: Diagnosis not present

## 2020-05-02 DIAGNOSIS — I4729 Other ventricular tachycardia: Secondary | ICD-10-CM

## 2020-05-02 DIAGNOSIS — Z79899 Other long term (current) drug therapy: Secondary | ICD-10-CM

## 2020-05-02 DIAGNOSIS — Z95810 Presence of automatic (implantable) cardiac defibrillator: Secondary | ICD-10-CM | POA: Diagnosis not present

## 2020-05-02 DIAGNOSIS — I5022 Chronic systolic (congestive) heart failure: Secondary | ICD-10-CM | POA: Diagnosis not present

## 2020-05-02 DIAGNOSIS — I493 Ventricular premature depolarization: Secondary | ICD-10-CM

## 2020-05-02 LAB — PACEMAKER DEVICE OBSERVATION

## 2020-05-02 MED ORDER — DAPAGLIFLOZIN PROPANEDIOL 10 MG PO TABS: 10.0000 mg | ORAL_TABLET | Freq: Every day | ORAL | 6 refills | Status: DC

## 2020-05-02 MED ORDER — AMIODARONE HCL 200 MG PO TABS: ORAL_TABLET | ORAL | 5 refills | Status: DC

## 2020-05-02 NOTE — Patient Instructions (Addendum)
Medication Instructions:  - Your physician has recommended you make the following change in your medication:   1) CHANGE amiodarone 200 mg- take 1 tablet (200 mg) by mouth every other day (or on the even days of the month)  2) START farxiga 10 mg- take 1 tablet (10 mg) by mouth once daily  Samples given: Farxiga 10 mg Lot: [id number] Exp: 08/28/2022 # 1 box   *If you need a refill on your cardiac medications before your next appointment, please call your pharmacy*   Lab Work: - Your physician recommends that you have lab work today: CMET/ TSH/ CBC   If you have labs (blood work) drawn today and your tests are completely normal, you will receive your results only by: Marland Kitchen MyChart Message (if you have MyChart) OR . A paper copy in the mail If you have any lab test that is abnormal or we need to change your treatment, we will call you to review the results.   Testing/Procedures:  1) Echocardiogram:  - Your physician has requested that you have an echocardiogram (after 05/05/20). Echocardiography is a painless test that uses sound waves to create images of your heart. It provides your doctor with information about the size and shape of your heart and how well your heart's chambers and valves are working. This procedure takes approximately one hour. There are no restrictions for this procedure. There is a possibility that an IV may need to be started during your test to inject an image enhancing agent. This is done to obtain more optimal pictures of your heart. Therefore we ask that you do at least drink some water prior to coming in to hydrate your veins.    2) Zio (heart) monitor: (05/02/20- 05/05/20)  Your physician has recommended that you wear a 3 day Zio XT monitor- placed in office today . This monitor is a medical device that records the heart's electrical activity. Doctors most often use these monitors to diagnose arrhythmias. Arrhythmias are problems with the speed or rhythm of the  heartbeat. The monitor is a small device applied to your chest. You can wear one while you do your normal daily activities. While wearing this monitor if you have any symptoms to push the button and record what you felt. Once you have worn this monitor for the period of time provider prescribed (Usually 14 days), you will return the monitor device in the postage paid box. Once it is returned they will download the data collected and provide [place] with a report which the provider will then review and we will call you with those results. Important tips:  1. Avoid showering during the first 24 hours of wearing the monitor. 2. Avoid excessive sweating to help maximize wear time. 3. Do not submerge the device, no hot tubs, and no swimming pools. 4. Keep any lotions or oils away from the patch. 5. After 24 hours you may shower with the patch on. Take brief showers with your back facing the shower head.  6. Do not remove patch once it has been placed because that will interrupt data and decrease adhesive wear time. 7. Push the button when you have any symptoms and write down what you were feeling. 8. Once you have completed wearing your monitor, remove and place into box which has postage paid and place in your outgoing mailbox.  9. If for some reason you have misplaced your box then call our office and we can provide another box and/or mail it off for  you.         Follow-Up: At [facility], you and your health needs are our priority.  As part of our continuing mission to provide you with exceptional heart care, we have created designated Provider Care Teams.  These Care Teams include your primary Cardiologist (physician) and Advanced Practice Providers (APPs -  Physician Assistants and Nurse Practitioners) who all work together to provide you with the care you need, when you need it.  We recommend signing up for the patient portal called "MyChart".  Sign up information is provided on this After Visit  Summary.  MyChart is used to connect with patients for Virtual Visits (Telemedicine).  Patients are able to view lab/test results, encounter notes, upcoming appointments, etc.  Non-urgent messages can be sent to your provider as well.   To learn more about what you can do with MyChart, go to [email or web address].    Your next appointment:    1) in 3 month(s) with Dr. Mariah Milling APP  2) in 6 month(s) with Dr. Graciela Husbands  The format for your next appointment:   In Person  Provider:   as above   Other Instructions   Dapagliflozin tablets What is this medicine? DAPAGLIFLOZIN (DAP a gli FLOE zin) controls blood sugar in people with diabetes. It is used with lifestyle changes like diet and exercise. It also treats heart failure and kidney disease. It may lower the risk for treatment of heart failure in the hospital or worsened kidney disease. This medicine may be used for other purposes; ask your health care provider or pharmacist if you have questions. COMMON BRAND NAME(S): Marcelline Deist What should I tell my health care provider before I take this medicine? They need to know if you have any of these conditions:  dehydration  diabetic ketoacidosis  diet low in salt  eating less due to illness, surgery, dieting, or any other reason  having surgery  history of pancreatitis or pancreas problems  history of yeast infection of the penis or vagina  if you often drink alcohol  infection in the bladder, kidneys, or urinary tract  kidney disease  low blood pressure  on dialysis  problems urinating  type 1 diabetes  uncircumcised female  an unusual or allergic reaction to dapagliflozin, other medicines, foods, dyes, or preservatives  pregnant or trying to get pregnant  breast-feeding How should I use this medicine? Take this medicine by mouth with water. Take it as directed on the prescription label at the same time every day. You can take it with or without food. If it upsets your  stomach, take it with food. Keep taking it unless your health care provider tells you to stop. A special MedGuide will be given to you by the pharmacist with each prescription and refill. Be sure to read this information carefully each time. Talk to your health care provider about the use of this medicine in children. Special care may be needed. Overdosage: If you think you have taken too much of this medicine contact a poison control center or emergency room at once. NOTE: This medicine is only for you. Do not share this medicine with others. What if I miss a dose? If you miss a dose, take it as soon as you can. If it is almost time for your next dose, take only that dose. Do not take double or extra doses. What may interact with this medicine? Interactions are not expected. This list may not describe all possible interactions. Give your health care provider  a list of all the medicines, herbs, non-prescription drugs, or dietary supplements you use. Also tell them if you smoke, drink alcohol, or use illegal drugs. Some items may interact with your medicine. What should I watch for while using this medicine? Visit your health care provider for regular checks on your progress. Tell your health care provider if your symptoms do not start to get better or if they get worse. This medicine can cause a serious condition in which there is too much acid in the blood. If you develop nausea, vomiting, stomach pain, unusual tiredness, or breathing problems, stop taking this medicine and call your doctor right away. If possible, use a ketone dipstick to check for ketones in your urine. Check with your health care provider if you have severe diarrhea, nausea, and vomiting, or if you sweat a lot. The loss of too much body fluid may make it dangerous for you to take this medicine. A test called the HbA1C (A1C) will be monitored. This is a simple blood test. It measures your blood sugar control over the last 2 to 3  months. You will receive this test every 3 to 6 months. Learn how to check your blood sugar. Learn the symptoms of low and high blood sugar and how to manage them. Always carry a quick-source of sugar with you in case you have symptoms of low blood sugar. Examples include hard sugar candy or glucose tablets. Make sure others know that you can choke if you eat or drink when you develop serious symptoms of low blood sugar, such as seizures or unconsciousness. Get medical help at once. Tell your health care provider if you have high blood sugar. You might need to change the dose of your medicine. If you are sick or exercising more than usual, you may need to change the dose of your medicine. Do not skip meals. Ask your health care provider if you should avoid alcohol. Many nonprescription cough and cold products contain sugar or alcohol. These can affect blood sugar. Wear a medical ID bracelet or chain. Carry a card that describes your condition. List the medicines and doses you take on the card. What side effects may I notice from receiving this medicine? Side effects that you should report to your doctor or health care professional as soon as possible:  allergic reactions (skin rash, itching or hives, swelling of the face, lips, or tongue)  breathing problems  dizziness  feeling faint or lightheaded, falls  genital infection (fever; tenderness, redness, or swelling in the genitals or area from the genitals to the back of the rectum)  kidney injury (trouble passing urine or change in the amount of urine)  low blood sugar (feeling anxious; confusion; dizziness; increased hunger; unusually weak or tired; increased sweating; shakiness; cold, clammy skin; irritable; headache; blurred vision; fast heartbeat; loss of consciousness)  muscle weakness  nausea, vomiting, unusual stomach upset or pain  new pain or tenderness, change in skin color, sores or ulcers, or infection in legs or feet  penile  discharge, itching, or pain  unusual tiredness  unusual vaginal discharge, itching, or odor  urinary tract infection (fever; chills; a burning feeling when urinating; urgent need to urinate more often; blood in the urine; back pain) Side effects that usually do not require medical attention (report to your doctor or health care professional if they continue or are bothersome):  mild increase in urination  thirsty This list may not describe all possible side effects. Call your doctor for  medical advice about side effects. You may report side effects to FDA at 1-800-FDA-1088. Where should I keep my medicine? Keep out of the reach of children and pets. Store at room temperature between 20 and 25 degrees C (68 and 77 degrees F). Get rid of any unused medicine after the expiration date. To get rid of medicines that are no longer needed or have expired:  Take the medicine to a medicine take-back program. Check with your pharmacy or law enforcement to find a location.  If you cannot return the medicine, check the label or package insert to see if the medicine should be thrown out in the garbage or flushed down the toilet. If you are not sure, ask your health care provider. If it is safe to put it in the trash, take the medicine out of the container. Mix the medicine with cat litter, dirt, coffee grounds, or other unwanted substance. Seal the mixture in a bag or container. Put it in the trash. NOTE: This sheet is a summary. It may not cover all possible information. If you have questions about this medicine, talk to your doctor, pharmacist, or health care provider.  2021 Elsevier/Gold Standard (2019-06-23 13:18:47)     Echocardiogram An echocardiogram is a test that uses sound waves (ultrasound) to produce images of the heart. Images from an echocardiogram can provide important information about:  Heart size and shape.  The size and thickness and movement of your heart's walls.  Heart  muscle function and strength.  Heart valve function or if you have stenosis. Stenosis is when the heart valves are too narrow.  If blood is flowing backward through the heart valves (regurgitation).  A tumor or infectious growth around the heart valves.  Areas of heart muscle that are not working well because of poor blood flow or injury from a heart attack.  Aneurysm detection. An aneurysm is a weak or damaged part of an artery wall. The wall bulges out from the normal force of blood pumping through the body. Tell a health care provider about:  Any allergies you have.  All medicines you are taking, including vitamins, herbs, eye drops, creams, and over-the-counter medicines.  Any blood disorders you have.  Any surgeries you have had.  Any medical conditions you have.  Whether you are pregnant or may be pregnant. What are the risks? Generally, this is a safe test. However, problems may occur, including an allergic reaction to dye (contrast) that may be used during the test. What happens before the test? No specific preparation is needed. You may eat and drink normally. What happens during the test?  You will take off your clothes from the waist up and put on a hospital gown.  Electrodes or electrocardiogram (ECG)patches may be placed on your chest. The electrodes or patches are then connected to a device that monitors your heart rate and rhythm.  You will lie down on a table for an ultrasound exam. A gel will be applied to your chest to help sound waves pass through your skin.  A handheld device, called a transducer, will be pressed against your chest and moved over your heart. The transducer produces sound waves that travel to your heart and bounce back (or "echo" back) to the transducer. These sound waves will be captured in real-time and changed into images of your heart that can be viewed on a video monitor. The images will be recorded on a computer and reviewed by your health  care provider.  You  may be asked to change positions or hold your breath for a short time. This makes it easier to get different views or better views of your heart.  In some cases, you may receive contrast through an IV in one of your veins. This can improve the quality of the pictures from your heart. The procedure may vary among health care providers and hospitals.   What can I expect after the test? You may return to your normal, everyday life, including diet, activities, and medicines, unless your health care provider tells you not to do that. Follow these instructions at home:  It is up to you to get the results of your test. Ask your health care provider, or the department that is doing the test, when your results will be ready.  Keep all follow-up visits. This is important. Summary  An echocardiogram is a test that uses sound waves (ultrasound) to produce images of the heart.  Images from an echocardiogram can provide important information about the size and shape of your heart, heart muscle function, heart valve function, and other possible heart problems.  You do not need to do anything to prepare before this test. You may eat and drink normally.  After the echocardiogram is completed, you may return to your normal, everyday life, unless your health care provider tells you not to do that. This information is not intended to replace advice given to you by your health care provider. Make sure you discuss any questions you have with your health care provider. Document Revised: 09/07/2019 Document Reviewed: 09/07/2019 Elsevier Patient Education  2021 [employer].

## 2020-05-02 NOTE — Progress Notes (Signed)
Patient Care Team: Dione Housekeeper, MD as PCP - General [name], MD as PCP - Cardiology (Cardiology) Duke Salvia, MD as PCP - Electrophysiology (Cardiology)   HPI  Charlene Schmitt is a 65 y.o. female seen in followup for ICD implanted for primary prevention in  the setting of a persistent nonischemic cardiomyopathy and modest congestive heart failure. It has been our impression that the myopathy preceded the ectopy.  She is on beta-blockers, RAAS blockers and hydralazine/nitrates.  Rash on losartan  1/18 >> Holter  22% PVCs with multiple morphologies and multiple runs ECGs were reviewed 2014--16 without evidence of ventricular ectopy ECG 1/18 demonstrated PVCs with left BBB superior axis   DATE TEST EF   10/13 CATH  15-20% NORMAL CAs  2/14 ECHO   25-30%   2/18 ECHO  25%   4/18 Echo  25-30%          Date TSH LFTs Hgb Cr K  5/ 18  0.659 13  1.34 4.3  9/21 0.614 16  13.5 1.34 4.7                Patient denies symptoms of GI intolerance, sun sensitivity, neurological symptoms or cough attributable to amiodarone.    Dyspnea on exertion.  No orthopnea or nocturnal dyspnea.  No peripheral edema.  Scant palpitations.   Current Outpatient Medications on File Prior to Visit  Medication Sig Dispense Refill  . amiodarone (PACERONE) 200 MG tablet Take 1 tablet (200 mg total) by mouth daily. 30 tablet 5  . aspirin 81 MG tablet Take 81 mg by mouth daily.    . carvedilol (COREG) 6.25 MG tablet Take 1 tablet (6.25 mg total) by mouth 2 (two) times daily with a meal. TAKE 1 TABLET (6.25 MG TOTAL) BY MOUTH 2 (TWO) TIMES DAILY WITH A MEAL. 60 tablet 5  . diphenhydrAMINE (BENADRYL) 25 mg capsule Take 25 mg by mouth daily.    . Fluticasone-Salmeterol (ADVAIR) 250-50 MCG/DOSE AEPB Inhale 1 puff into the lungs as needed (for shortness of breath).     . hydrALAZINE (APRESOLINE) 10 MG tablet Take 1 tablet (10 mg total) by mouth 3 (three) times daily.  90 tablet 5  . ibuprofen (ADVIL,MOTRIN) 200 MG tablet Take 400 mg by mouth every 6 (six) hours as needed for headache or moderate pain.     . potassium chloride SA (KLOR-CON) 20 MEQ tablet Take 1 tablet (20 mEq total) by mouth 2 (two) times daily. 60 tablet 5  . spironolactone (ALDACTONE) 25 MG tablet Take 1 tablet (25 mg total) by mouth daily. 30 tablet 5  . torsemide (DEMADEX) 20 MG tablet Take 1 tablet (20 mg total) by mouth daily. 30 tablet 5  . isosorbide mononitrate (IMDUR) 30 MG 24 hr tablet Take 1 tablet (30 mg total) by mouth daily. 30 tablet 5   No current facility-administered medications on file prior to visit.     Physical Exam BP (!) 122/94   Pulse 70   Ht 5\' 6"  (1.676 m)   Wt 234 lb (106.1 kg)   BMI 37.77 kg/m   Well developed and Morbidly obese   in no acute distress HENT normal Neck supple with JVP-8-10 cm Clear Device pocket well healed; without hematoma or erythema.  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  We will decrease her amiodarone which she is currently taking at about 1000 mg a week--700 mg a week  Reassess left ventricular function on medical therapy as well as with PVC suppression  With her cardiomyopathy and dyspnea, we will begin her on Farxiga  Have reached out to pharmacy as to whether her rash to losartan should preclude ACE-previously a cough on ACE.  Pharmacy  suggest alternative ARB.  We will make the above adjustments first.  Blood pressure well controlled  Still smoking but not interested in stopping  Does some self medication adjustments depending on whether her medications bothered her taste

## 2020-05-03 LAB — COMPREHENSIVE METABOLIC PANEL
ALT: 20 IU/L (ref 0–32)
AST: 19 IU/L (ref 0–40)
Albumin/Globulin Ratio: 1.3 (ref 1.2–2.2)
Albumin: 3.9 g/dL (ref 3.8–4.8)
Alkaline Phosphatase: 85 IU/L (ref 44–121)
BUN/Creatinine Ratio: 13 (ref 12–28)
BUN: 18 mg/dL (ref 8–27)
Bilirubin Total: 0.4 mg/dL (ref 0.0–1.2)
CO2: 22 mmol/L (ref 20–29)
Calcium: 9.6 mg/dL (ref 8.7–10.3)
Chloride: 106 mmol/L (ref 96–106)
Creatinine, Ser: 1.37 mg/dL — ABNORMAL HIGH (ref 0.57–1.00)
Globulin, Total: 2.9 g/dL (ref 1.5–4.5)
Glucose: 101 mg/dL — ABNORMAL HIGH (ref 65–99)
Potassium: 4.8 mmol/L (ref 3.5–5.2)
Sodium: 144 mmol/L (ref 134–144)
Total Protein: 6.8 g/dL (ref 6.0–8.5)
eGFR: 43 mL/min/{1.73_m2} — ABNORMAL LOW (ref >59)

## 2020-05-03 LAB — CBC WITH DIFFERENTIAL/PLATELET
Basophils Absolute: 0 10*3/uL (ref 0.0–0.2)
Basos: 0 %
EOS (ABSOLUTE): 0.2 10*3/uL (ref 0.0–0.4)
Eos: 5 %
Hematocrit: 41.9 % (ref 34.0–46.6)
Hemoglobin: 13.4 g/dL (ref 11.1–15.9)
Immature Grans (Abs): 0 10*3/uL (ref 0.0–0.1)
Immature Granulocytes: 0 %
Lymphocytes Absolute: 1.1 10*3/uL (ref 0.7–3.1)
Lymphs: 29 %
MCH: 29.1 pg (ref 26.6–33.0)
MCHC: 32 g/dL (ref 31.5–35.7)
MCV: 91 fL (ref 79–97)
Monocytes Absolute: 0.6 10*3/uL (ref 0.1–0.9)
Monocytes: 15 %
Neutrophils Absolute: 1.9 10*3/uL (ref 1.4–7.0)
Neutrophils: 51 %
Platelets: 250 10*3/uL (ref 150–450)
RBC: 4.6 x10E6/uL (ref 3.77–5.28)
RDW: 14 % (ref 11.7–15.4)
WBC: 3.7 10*3/uL (ref 3.4–10.8)

## 2020-05-03 LAB — TSH: TSH: 0.797 u[IU]/mL (ref 0.450–4.500)

## 2020-05-05 DIAGNOSIS — I493 Ventricular premature depolarization: Secondary | ICD-10-CM

## 2020-05-08 ENCOUNTER — Telehealth: Payer: Self-pay | Admitting: Internal Medicine

## 2020-05-08 NOTE — Telephone Encounter (Signed)
Duke Salvia, MD  05/06/2020 4:41 PM EDT      Please inform patient that drug surveillance labs are normal

## 2020-05-08 NOTE — Telephone Encounter (Signed)
I spoke with the patient regarding her normal lab results.  She did want to let Dr. Graciela Husbands know that she is having nausea on the Farxiga 10 mg once daily that was started on 05/02/20. She states she has tried taking this with and without food and the effects are the same.  I have advised her to stop [place] for now to see if symptoms improve. She advised she was going to increase her amiodarone back to 1 tablet daily. She was advised to change this to: amiodarone 200 mg- take 1 tablet (200 mg) by mouth every other day (or on the even days of the month)  This change was also made a her 05/02/20 appointment.  I have advised her to continue amiodarone at the QOD dosing as the amiodarone and farxiga work differently and are used for 2 different things.  She is aware I will review with Dr. Graciela Husbands and call her back with further MD recommendations.  The patient voices understanding and is agreeable.

## 2020-05-11 ENCOUNTER — Encounter: Payer: Self-pay | Admitting: *Deleted

## 2020-05-11 MED ORDER — EMPAGLIFLOZIN 10 MG PO TABS: 10.0000 mg | ORAL_TABLET | Freq: Every day | ORAL | 0 refills | Status: DC

## 2020-05-11 NOTE — Telephone Encounter (Signed)
I spoke with Dr. Graciela Husbands regarding the message below where the patient reported that she was unable to tolerate the Farxiga medication due to nausea.   Per Dr. Graciela Husbands: - continue amiodarone at 200 mg every other day - if nausea has improved, see if the patient is willing to try Jardiance 10 mg once daily  I have called and spoken with the patient and advised her of Dr. Odessa Fleming recommendations as above.  I explained to the patient that I do have samples of Jardiance in the office that we could try her on to see if she tolerates this.  She is aware I will try to pull 2 weeks worth of samples for her. I asked that she call me when she has about 2-3 days worth of samples to let me know how she is feeling on this. If she is tolerating, we will then send in a RX to the pharmacy.  The patient voices understanding and is agreeable. She is aware her samples will be placed at our front desk for pick up.  Samples Given: Jardiance 10 mg Lot: [id number] Exp: 09/2021 # 2 boxes given Free trial card also placed in her sample bag.

## 2020-05-25 ENCOUNTER — Other Ambulatory Visit: Payer: Self-pay

## 2020-05-25 ENCOUNTER — Ambulatory Visit (INDEPENDENT_AMBULATORY_CARE_PROVIDER_SITE_OTHER): Payer: Medicare HMO

## 2020-05-25 DIAGNOSIS — I428 Other cardiomyopathies: Secondary | ICD-10-CM

## 2020-05-25 DIAGNOSIS — I5022 Chronic systolic (congestive) heart failure: Secondary | ICD-10-CM | POA: Diagnosis not present

## 2020-05-25 LAB — ECHOCARDIOGRAM COMPLETE
AR max vel: 2 cm2
AV Area VTI: 2.53 cm2
AV Area mean vel: 2.32 cm2
AV Mean grad: 4 mmHg
AV Peak grad: 9.1 mmHg
Ao pk vel: 1.51 m/s
Area-P 1/2: 3.34 cm2
Calc EF: 35.4 %
S' Lateral: 4 cm
Single Plane A2C EF: 36.4 %
Single Plane A4C EF: 33.8 %

## 2020-07-07 ENCOUNTER — Ambulatory Visit (INDEPENDENT_AMBULATORY_CARE_PROVIDER_SITE_OTHER): Payer: Medicare HMO

## 2020-07-07 DIAGNOSIS — I428 Other cardiomyopathies: Secondary | ICD-10-CM

## 2020-07-10 LAB — CUP PACEART REMOTE DEVICE CHECK
Battery Remaining Longevity: 150 mo
Battery Remaining Percentage: 100 %
Brady Statistic RV Percent Paced: 0 %
Date Time Interrogation Session: 20220610004100
HighPow Impedance: 81 Ohm
Implantable Lead Implant Date: 20180523
Implantable Lead Location: 753860
Implantable Lead Model: 293
Implantable Lead Serial Number: 430811
Implantable Pulse Generator Implant Date: 20180523
Lead Channel Impedance Value: 753 Ohm
Lead Channel Setting Pacing Amplitude: 2.5 V
Lead Channel Setting Pacing Pulse Width: 0.4 ms
Lead Channel Setting Sensing Sensitivity: 0.5 mV
Pulse Gen Serial Number: 225390

## 2020-07-25 NOTE — Progress Notes (Signed)
Remote ICD transmission.   

## 2020-07-27 ENCOUNTER — Other Ambulatory Visit: Payer: Self-pay | Admitting: Family

## 2020-07-27 DIAGNOSIS — I5022 Chronic systolic (congestive) heart failure: Secondary | ICD-10-CM

## 2020-07-27 DIAGNOSIS — I428 Other cardiomyopathies: Secondary | ICD-10-CM

## 2020-07-31 NOTE — Progress Notes (Signed)
Date:  08/01/2020   ID:  Charlene Schmitt, DOB [birth year], MRN [id number]  Patient Location:  [address] [place] [address]   Provider location:   [facility], [place] office  PCP:  Dione Housekeeper, MD  Cardiologist:  [facility]   Chief Complaint  Patient presents with   3 month follow up     "Doing well." Medications reviewed by the patient verbally.     History of Present Illness:    Charlene Schmitt is a 65 y.o. female  past medical history of obesity,  hypertension,  nonischemic cardiomyopathy,ICD placed in 05/2016 ejection fraction less than 25%, In 2018 cardiac catheterization showing no significant coronary artery disease, normal right ventricular systolic pressures on echocardiogram.  Smoker  She presents for routine follow up of her cardiomyopathy.  Stomach upset on jardiance, had to stop Started by Dr. Darlyn Read On carvedilol, isosorbide, hydralazine, spironolactone  Continues to smoke, has been unsuccessful in her attempts to quit smoking  Chronic hip and knee pain, previously with cortisone shots No regular exercise program Weight high  ICD downloads reviewed, minimal arrhythmia Report discussed with her  Continues on torsemide 20 daily, weight trending up slightly Reports when she has fluid develops this any chest breast area, not on the legs or lower abdomen  Discussed previous medication intolerances Rash on losartan, cough on ACE inhibitor Not on Entresto  Retired, Worked at [employer] for many years active with grandchildren  EKG personally reviewed by myself on todays visit Normal sinus rhythm with rate 75 bpm nonspecific T wave abnormality anterolateral leads, no change from previous EKG in 2020  Other past medical history   x-rays showing L2-L5 significant disc disease Previous renal dysfunction in the setting of NSAIDs and diuresis renal ultrasound 05/29/2011 showing no  hydronephrosis  seen by renal service at [facility]    Prior CV studies:   The following studies were reviewed today:   Past Medical History:  Diagnosis Date   AICD (automatic cardioverter/defibrillator) present    Arthritis    "some; treated w/ibuprofen" (06/19/2016)   Chronic kidney disease    Chronic lower back pain    Chronic systolic CHF (congestive heart failure) (HCC)    a. 10/2011 Cath: nl cors, EF 15-20%;  b. 02/2012 Echo: EF 25-30%; c. 02/2016 Echo: EF 20-25%, diff HK, mild MR, mildly dil LA.   COPD (chronic obstructive pulmonary disease) (HCC)    History of recurrent miscarriages, not currently pregnant    Hypertension    Morbid obesity (HCC)    NICM (nonischemic cardiomyopathy) (HCC)    a. 10/2011 Cath: nl cors, EF 15-20%;  b. 02/2012 Echo: EF 25-30%; c. 02/2016 Echo: EF 20-25%; d. 05/2016 s/p BSX Vigilant EL single lead AICD.   PVC's (premature ventricular contractions)    a. 01/2016 Holter: 22% PVC's w/ multiple morphologies with runs.   Tobacco abuse    Past Surgical History:  Procedure Laterality Date   CARDIAC CATHETERIZATION  11/19/2011   GANGLION CYST EXCISION Right    ICD IMPLANT N/A 06/19/2016   Procedure: ICD Implant;  Surgeon: Duke Salvia, MD;  Location: [facility] INVASIVE CV LAB;  Service: Cardiovascular;  Laterality: N/A;   LACERATION REPAIR     finger; s/p stitches   TOOTH EXTRACTION     x2     Current Meds  Medication Sig   amiodarone (PACERONE) 200 MG tablet Take 1 tablet (200 mg) by mouth once every other day  aspirin 81 MG tablet Take 81 mg by mouth daily.   carvedilol (COREG) 6.25 MG tablet Take 1 tablet (6.25 mg total) by mouth 2 (two) times daily with a meal. TAKE 1 TABLET (6.25 MG TOTAL) BY MOUTH 2 (TWO) TIMES DAILY WITH A MEAL.   diphenhydrAMINE (BENADRYL) 25 mg capsule Take 25 mg by mouth daily.   Fluticasone-Salmeterol (ADVAIR) 250-50 MCG/DOSE AEPB Inhale 1 puff into the lungs as needed (for shortness of breath).    hydrALAZINE (APRESOLINE) 10 MG  tablet Take 1 tablet (10 mg total) by mouth 3 (three) times daily.   ibuprofen (ADVIL,MOTRIN) 200 MG tablet Take 400 mg by mouth every 6 (six) hours as needed for headache or moderate pain.    isosorbide mononitrate (IMDUR) 30 MG 24 hr tablet Take 1 tablet (30 mg total) by mouth daily.   potassium chloride SA (KLOR-CON) 20 MEQ tablet Take 1 tablet (20 mEq total) by mouth 2 (two) times daily.   torsemide (DEMADEX) 20 MG tablet Take 1 tablet (20 mg total) by mouth daily.   spironolactone (ALDACTONE) 25 MG tablet Take 1 tablet (25 mg total) by mouth daily.     Allergies:   Ace inhibitors, Losartan, and Penicillins   Social History   Tobacco Use   Smoking status: Every Day    Packs/day: 0.50    Years: 48.00    Pack years: 24.00    Types: Cigarettes   Smokeless tobacco: Never  Vaping Use   Vaping Use: Never used  Substance Use Topics   Alcohol use: Yes    Comment: 06/19/2016 "nothing in years"   Drug use: No     Current Outpatient Medications on File Prior to Visit  Medication Sig Dispense Refill   amiodarone (PACERONE) 200 MG tablet Take 1 tablet (200 mg) by mouth once every other day 30 tablet 5   aspirin 81 MG tablet Take 81 mg by mouth daily.     carvedilol (COREG) 6.25 MG tablet Take 1 tablet (6.25 mg total) by mouth 2 (two) times daily with a meal. TAKE 1 TABLET (6.25 MG TOTAL) BY MOUTH 2 (TWO) TIMES DAILY WITH A MEAL. 60 tablet 5   diphenhydrAMINE (BENADRYL) 25 mg capsule Take 25 mg by mouth daily.     Fluticasone-Salmeterol (ADVAIR) 250-50 MCG/DOSE AEPB Inhale 1 puff into the lungs as needed (for shortness of breath).      hydrALAZINE (APRESOLINE) 10 MG tablet Take 1 tablet (10 mg total) by mouth 3 (three) times daily. 90 tablet 5   ibuprofen (ADVIL,MOTRIN) 200 MG tablet Take 400 mg by mouth every 6 (six) hours as needed for headache or moderate pain.      isosorbide mononitrate (IMDUR) 30 MG 24 hr tablet Take 1 tablet (30 mg total) by mouth daily. 30 tablet 5   potassium  chloride SA (KLOR-CON) 20 MEQ tablet Take 1 tablet (20 mEq total) by mouth 2 (two) times daily. 60 tablet 5   torsemide (DEMADEX) 20 MG tablet Take 1 tablet (20 mg total) by mouth daily. 30 tablet 5   No current facility-administered medications on file prior to visit.     Family Hx: The patient's family history includes Hypertension in her father and mother.  ROS:   Please see the history of present illness.    Review of Systems  Constitutional: Negative.   Respiratory: Negative.    Cardiovascular: Negative.   Gastrointestinal: Negative.   Musculoskeletal: Negative.   Neurological: Negative.   Psychiatric/Behavioral: Negative.    All other  systems reviewed and are negative.   Labs/Other Tests and Data Reviewed:    Recent Labs: 05/02/2020: ALT 20; BUN 18; Creatinine, Ser 1.37; Hemoglobin 13.4; Platelets 250; Potassium 4.8; Sodium 144; TSH 0.797   Recent Lipid Panel Lab Results  Component Value Date/Time   CHOL 172 11/18/2011 04:55 AM   TRIG 89 11/18/2011 04:55 AM   HDL 74 (H) 11/18/2011 04:55 AM   LDLCALC 80 11/18/2011 04:55 AM    Wt Readings from Last 3 Encounters:  08/01/20 234 lb 2 oz (106.2 kg)  05/02/20 234 lb (106.1 kg)  04/18/20 228 lb (103.4 kg)     Exam:    BP 124/82 (BP Location: Left Arm, Patient Position: Sitting, Cuff Size: Normal)   Pulse 75   Ht 5\' 6"  (1.676 m)   Wt 234 lb 2 oz (106.2 kg)   SpO2 98%   BMI 37.79 kg/m  Constitutional:  oriented to person, place, and time. No distress.  HENT:  Head: Grossly normal Eyes:  no discharge. No scleral icterus.  Neck: No JVD, no carotid bruits  Cardiovascular: Regular rate and rhythm, no murmurs appreciated Pulmonary/Chest: Clear to auscultation bilaterally, no wheezes or rails Abdominal: Soft.  no distension.  no tenderness.  Musculoskeletal: Normal range of motion Neurological:  normal muscle tone. Coordination normal. No atrophy Skin: Skin warm and dry Psychiatric: normal affect,  pleasant   ASSESSMENT & PLAN:    1. Nonischemic cardiomyopathy (HCC) Tolerating current medications, did not tolerate Jardiance Rash on ARB, cough on ACE No medication changes made today, no further testing Echocardiogram ejection fraction 35%  2. NSVT (nonsustained ventricular tachycardia) (HCC) Pacer downloads reviewed, no significant arrhythmia Followed by Dr.  3. Chronic systolic heart failure (HCC) Discussed various medication options available, she prefers no changes at this time Latest trial, Jardiance was $100 for her and upset her stomach.  She stopped the medication  4. ICD (implantable cardioverter-defibrillator) in place Device placed May 2018 Followed by Dr. June 2018  5. Frequent PVCs Asymptomatic, previously noted on EKGs  6. Claudication (HCC) ABIs negative, just has chronic leg pain Reports symptoms stable, no regular exercise  7. Essential hypertension Blood pressure is well controlled on today's visit. No changes made to the medications.    Total encounter time more than 25 minutes  Greater than 50% was spent in counseling and coordination of care with the patient   Signed, Graciela Husbands, MD  08/01/2020 8:53 AM    [facility] [address], [place], [place]

## 2020-08-01 ENCOUNTER — Ambulatory Visit: Payer: Medicare HMO | Admitting: Cardiovascular Disease

## 2020-08-01 ENCOUNTER — Encounter: Payer: Self-pay | Admitting: Cardiovascular Disease

## 2020-08-01 ENCOUNTER — Other Ambulatory Visit: Payer: Self-pay

## 2020-08-01 VITALS — BP 124/82 | HR 75 | Ht 66.0 in | Wt 234.1 lb

## 2020-08-01 DIAGNOSIS — I10 Essential (primary) hypertension: Secondary | ICD-10-CM

## 2020-08-01 DIAGNOSIS — I5022 Chronic systolic (congestive) heart failure: Secondary | ICD-10-CM | POA: Diagnosis not present

## 2020-08-01 DIAGNOSIS — I472 Ventricular tachycardia: Secondary | ICD-10-CM

## 2020-08-01 DIAGNOSIS — Z95810 Presence of automatic (implantable) cardiac defibrillator: Secondary | ICD-10-CM | POA: Diagnosis not present

## 2020-08-01 DIAGNOSIS — I428 Other cardiomyopathies: Secondary | ICD-10-CM | POA: Diagnosis not present

## 2020-08-01 DIAGNOSIS — Z79899 Other long term (current) drug therapy: Secondary | ICD-10-CM

## 2020-08-01 DIAGNOSIS — I493 Ventricular premature depolarization: Secondary | ICD-10-CM

## 2020-08-01 DIAGNOSIS — I4729 Other ventricular tachycardia: Secondary | ICD-10-CM

## 2020-08-01 MED ORDER — TORSEMIDE 20 MG PO TABS: 20.0000 mg | ORAL_TABLET | Freq: Every day | ORAL | 11 refills | Status: DC

## 2020-08-01 MED ORDER — SPIRONOLACTONE 25 MG PO TABS: 25.0000 mg | ORAL_TABLET | Freq: Every day | ORAL | 11 refills | Status: DC

## 2020-08-01 MED ORDER — SPIRONOLACTONE 25 MG PO TABS: 25.0000 mg | ORAL_TABLET | Freq: Every day | ORAL | 5 refills | Status: DC

## 2020-08-01 MED ORDER — CARVEDILOL 6.25 MG PO TABS: 6.2500 mg | ORAL_TABLET | Freq: Two times a day (BID) | ORAL | 11 refills | Status: DC

## 2020-08-01 MED ORDER — HYDRALAZINE HCL 10 MG PO TABS: 10.0000 mg | ORAL_TABLET | Freq: Three times a day (TID) | ORAL | 11 refills | Status: DC

## 2020-08-01 MED ORDER — AMIODARONE HCL 200 MG PO TABS: ORAL_TABLET | ORAL | 11 refills | Status: DC

## 2020-08-01 MED ORDER — POTASSIUM CHLORIDE CRYS ER 20 MEQ PO TBCR: 20.0000 meq | EXTENDED_RELEASE_TABLET | Freq: Two times a day (BID) | ORAL | 11 refills | Status: DC

## 2020-08-01 MED ORDER — ISOSORBIDE MONONITRATE ER 30 MG PO TB24: 30.0000 mg | ORAL_TABLET | Freq: Every day | ORAL | 11 refills | Status: DC

## 2020-08-01 NOTE — Patient Instructions (Addendum)
Medication Instructions:  No changes, please continue your current medications   If you need a refill on your cardiac medications before your next appointment, please call your pharmacy.   Lab work: No new labs needed  Testing/Procedures: No new testing needed   Follow-Up: At [facility], you and your health needs are our priority.  As part of our continuing mission to provide you with exceptional heart care, we have created designated Provider Care Teams.  These Care Teams include your primary Cardiologist (physician) and Advanced Practice Providers (APPs -  Physician Assistants and Nurse Practitioners) who all work together to provide you with the care you need, when you need it.  You will need a follow up appointment in 6 months  Providers on your designated Care Team:   Christopher Berge, NP Ryan Dunn, PA-C Jacquelyn Visser, PA-C Cadence Furth, PA-C   COVID-19 Vaccine Information can be found at: [email or web address] For questions related to vaccine distribution or appointments, please email [email or web address] or call [phone].    

## 2020-10-06 ENCOUNTER — Ambulatory Visit (INDEPENDENT_AMBULATORY_CARE_PROVIDER_SITE_OTHER): Payer: Medicare HMO

## 2020-10-06 DIAGNOSIS — I428 Other cardiomyopathies: Secondary | ICD-10-CM | POA: Diagnosis not present

## 2020-10-06 LAB — CUP PACEART REMOTE DEVICE CHECK
Battery Remaining Longevity: 156 mo
Battery Remaining Percentage: 100 %
Brady Statistic RV Percent Paced: 0 %
Date Time Interrogation Session: 20220909004000
HighPow Impedance: 82 Ohm
Implantable Lead Implant Date: 20180523
Implantable Lead Location: 753860
Implantable Lead Model: 293
Implantable Lead Serial Number: 430811
Implantable Pulse Generator Implant Date: 20180523
Lead Channel Impedance Value: 772 Ohm
Lead Channel Setting Pacing Amplitude: 2.5 V
Lead Channel Setting Pacing Pulse Width: 0.4 ms
Lead Channel Setting Sensing Sensitivity: 0.5 mV
Pulse Gen Serial Number: 225390

## 2020-10-10 NOTE — Progress Notes (Signed)
Remote ICD transmission.   

## 2020-10-23 ENCOUNTER — Ambulatory Visit: Payer: Medicare HMO | Admitting: Cardiovascular Disease

## 2021-01-05 ENCOUNTER — Ambulatory Visit (INDEPENDENT_AMBULATORY_CARE_PROVIDER_SITE_OTHER): Payer: Medicare HMO

## 2021-01-05 DIAGNOSIS — I428 Other cardiomyopathies: Secondary | ICD-10-CM

## 2021-01-05 LAB — CUP PACEART REMOTE DEVICE CHECK
Battery Remaining Longevity: 150 mo
Battery Remaining Percentage: 100 %
Brady Statistic RV Percent Paced: 0 %
Date Time Interrogation Session: 20221209004100
HighPow Impedance: 93 Ohm
Implantable Lead Implant Date: 20180523
Implantable Lead Location: 753860
Implantable Lead Model: 293
Implantable Lead Serial Number: 430811
Implantable Pulse Generator Implant Date: 20180523
Lead Channel Impedance Value: 839 Ohm
Lead Channel Setting Pacing Amplitude: 2.5 V
Lead Channel Setting Pacing Pulse Width: 0.4 ms
Lead Channel Setting Sensing Sensitivity: 0.5 mV
Pulse Gen Serial Number: 225390

## 2021-01-15 NOTE — Progress Notes (Signed)
Remote ICD transmission.   

## 2021-01-23 ENCOUNTER — Other Ambulatory Visit: Payer: Self-pay

## 2021-01-23 ENCOUNTER — Encounter: Payer: Self-pay | Admitting: Internal Medicine

## 2021-01-23 ENCOUNTER — Ambulatory Visit (INDEPENDENT_AMBULATORY_CARE_PROVIDER_SITE_OTHER): Payer: Medicare HMO | Admitting: Internal Medicine

## 2021-01-23 VITALS — BP 130/86 | HR 77 | Ht 66.0 in | Wt 233.0 lb

## 2021-01-23 DIAGNOSIS — I428 Other cardiomyopathies: Secondary | ICD-10-CM | POA: Diagnosis not present

## 2021-01-23 DIAGNOSIS — Z79899 Other long term (current) drug therapy: Secondary | ICD-10-CM

## 2021-01-23 DIAGNOSIS — I5022 Chronic systolic (congestive) heart failure: Secondary | ICD-10-CM | POA: Diagnosis not present

## 2021-01-23 DIAGNOSIS — I493 Ventricular premature depolarization: Secondary | ICD-10-CM

## 2021-01-23 DIAGNOSIS — I4729 Other ventricular tachycardia: Secondary | ICD-10-CM | POA: Diagnosis not present

## 2021-01-23 NOTE — Patient Instructions (Signed)
Medication Instructions:   Your physician recommends that you continue on your current medications as directed. Please refer to the Current Medication list given to you today.  *If you need a refill on your cardiac medications before your next appointment, please call your pharmacy*   Lab Work:  Today: CMET, TSH  If you have labs (blood work) drawn today and your tests are completely normal, you will receive your results only by: MyChart Message (if you have MyChart) OR A paper copy in the mail If you have any lab test that is abnormal or we need to change your treatment, we will call you to review the results.   Testing/Procedures:  None ordered   Follow-Up: At [facility], you and your health needs are our priority.  As part of our continuing mission to provide you with exceptional heart care, we have created designated Provider Care Teams.  These Care Teams include your primary Cardiologist (physician) and Advanced Practice Providers (APPs -  Physician Assistants and Nurse Practitioners) who all work together to provide you with the care you need, when you need it.  We recommend signing up for the patient portal called "MyChart".  Sign up information is provided on this After Visit Summary.  MyChart is used to connect with patients for Virtual Visits (Telemedicine).  Patients are able to view lab/test results, encounter notes, upcoming appointments, etc.  Non-urgent messages can be sent to your provider as well.   To learn more about what you can do with MyChart, go to [email or web address].    Your next appointment:   6 month(s)  The format for your next appointment:   In Person  Provider:   Sherryl Manges, MD

## 2021-01-23 NOTE — Progress Notes (Signed)
Patient Care Team: Dione Housekeeper, MD as PCP - General [name], MD as PCP - Cardiology (Cardiology) Duke Salvia, MD as PCP - Electrophysiology (Cardiology)   HPI  Charlene Schmitt is a 65 y.o. female seen in followup for ICD-[employer] implanted 5/18 for primary prevention in  the setting of a persistent nonischemic cardiomyopathy and modest congestive heart failure and complex frequent ventricular ectopy now treated with amio.  It has been our impression that the myopathy preceded the ectopy.   She is on beta-blockers, RAAS blockers and hydralazine/nitrates.  Rash on losartan & has declined entresto in the past.      ECG 1/18 demonstrated PVCs with left BBB superior axis  The patient denies  nocturnal dyspnea, orthopnea or peripheral edema.  There have been no palpitations, lightheadedness or syncope.  Complains of dyspnea with prolonged exertion-- stable and notes chest pressure when her weight is up and responds to diuretics   Patient denies symptoms of GI intolerance, sun sensitivity, neurological symptoms attributable to amiodarone.      Date % PVCs   1/18 22% multiple morphologies & runs  4/22 2.8%        DATE TEST  EF    10/13 CATH  15-20% NORMAL CAs  2/14 ECHO    25-30 %    2/18 ECHO  25%    4/18 Echo  25-30%     4/22  Echo  30-35%       Date TSH LFTs Hgb Cr K  5/ 18  0.659 13   1.34 4.3  9/21 0.614 16  13.5 1.34 4.7  4/22 0.797 20 13.4 1.37 4.8            Dyspnea on exertion. SGLT-2 ( 4/22) did not tolerate.      Current Outpatient Medications on File Prior to Visit  Medication Sig Dispense Refill   amiodarone (PACERONE) 200 MG tablet Take 1 tablet (200 mg) by mouth once every other day 30 tablet 11   aspirin 81 MG tablet Take 81 mg by mouth daily.     carvedilol (COREG) 6.25 MG tablet Take 1 tablet (6.25 mg total) by mouth 2 (two) times daily with a meal. TAKE 1 TABLET (6.25 MG TOTAL) BY MOUTH 2 (TWO) TIMES DAILY WITH A  MEAL. 60 tablet 11   diphenhydrAMINE (BENADRYL) 25 mg capsule Take 25 mg by mouth daily.     Fluticasone-Salmeterol (ADVAIR) 250-50 MCG/DOSE AEPB Inhale 1 puff into the lungs as needed (for shortness of breath).      hydrALAZINE (APRESOLINE) 10 MG tablet Take 1 tablet (10 mg total) by mouth 3 (three) times daily. 90 tablet 11   ibuprofen (ADVIL,MOTRIN) 200 MG tablet Take 400 mg by mouth every 6 (six) hours as needed for headache or moderate pain.      potassium chloride SA (KLOR-CON) 20 MEQ tablet Take 1 tablet (20 mEq total) by mouth 2 (two) times daily. 60 tablet 11   spironolactone (ALDACTONE) 25 MG tablet TAKE 1 TABLET BY MOUTH EVERY DAY 90 tablet 3   torsemide (DEMADEX) 20 MG tablet Take 1 tablet (20 mg total) by mouth daily. 30 tablet 11   isosorbide mononitrate (IMDUR) 30 MG 24 hr tablet Take 1 tablet (30 mg total) by mouth daily. (Patient not taking: Reported on 01/23/2021) 30 tablet 11   No current facility-administered medications on file prior to visit.     Physical Exam BP 130/86    Pulse 77  Ht 5\' 6"  (1.676 m)    Wt 233 lb (105.7 kg)    SpO2 98%    BMI 37.61 kg/m  Well developed and Morbidly obese in no acute distress HENT normal Neck supple  t Clear Device pocket well healed; without hematoma or erythema.  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  Need to check surveillance labs  Pt heart failure status is stable.  Continue torsemide 20 mg.  Electrolytes are stable.  Hypertension reasonably controlled.  Continue hydralazine, will resume isosorbide but try taking at night \\Still  smoking :((

## 2021-01-24 LAB — COMPREHENSIVE METABOLIC PANEL
ALT: 15 IU/L (ref 0–32)
AST: 20 IU/L (ref 0–40)
Albumin/Globulin Ratio: 1.4 (ref 1.2–2.2)
Albumin: 3.9 g/dL (ref 3.8–4.8)
Alkaline Phosphatase: 78 IU/L (ref 44–121)
BUN/Creatinine Ratio: 18 (ref 12–28)
BUN: 22 mg/dL (ref 8–27)
Bilirubin Total: 0.3 mg/dL (ref 0.0–1.2)
CO2: 20 mmol/L (ref 20–29)
Calcium: 10.1 mg/dL (ref 8.7–10.3)
Chloride: 107 mmol/L — ABNORMAL HIGH (ref 96–106)
Creatinine, Ser: 1.21 mg/dL — ABNORMAL HIGH (ref 0.57–1.00)
Globulin, Total: 2.8 g/dL (ref 1.5–4.5)
Glucose: 105 mg/dL — ABNORMAL HIGH (ref 70–99)
Potassium: 4.9 mmol/L (ref 3.5–5.2)
Sodium: 142 mmol/L (ref 134–144)
Total Protein: 6.7 g/dL (ref 6.0–8.5)
eGFR: 49 mL/min/{1.73_m2} — ABNORMAL LOW (ref >59)

## 2021-01-24 LAB — TSH: TSH: 0.561 u[IU]/mL (ref 0.450–4.500)

## 2021-02-18 NOTE — Progress Notes (Signed)
Date:  02/19/2021   ID:  Charlene Schmitt, DOB 1955-09-02, MRN [id number]  Patient Location:  [address] [place] [address]   Provider location:   [facility], [place] office  PCP:  Dione Housekeeper, MD  Cardiologist:  Hubbard Robinson [facility]   Chief Complaint  Patient presents with   6 month follow up     "Doing well." Medications reviewed by the patient verbally.     History of Present Illness:    Charlene Schmitt is a 66 y.o. female  past medical history of obesity,  hypertension,  nonischemic cardiomyopathy,ICD placed in 05/2016 ejection fraction less than 25%, In 2018 cardiac catheterization showing no significant coronary artery disease, normal right ventricular systolic pressures on echocardiogram.  Smoker  She presents for routine follow up of her cardiomyopathy.  Last seen in clinic by myself July 2022  Seen by Dr. Graciela Husbands EP December 2022 Troponin PVCs down to 2.8% as of April 2022 Ejection fraction at that time 30-35% Reported she was smoking at that time On amiodarone 200 every other day, torsemide 20 daily Recommended she restart her isosorbide in the evening  ICD download reviewed, no arrhythmia   On carvedilol, isosorbide, hydralazine, spironolactone Stomach upset on jardiance, had to stop  Continues to smoke,   unsuccessful in her attempts to quit smoking  Chronic hip and knee pain, previously with cortisone shots No regular exercise program  Continues on torsemide 20 daily, but recently reports skipping several days Weight is stable  Discussed previous medication intolerances Rash on losartan, cough on ACE inhibitor Not on Entresto presumably secondary to ARB rash He does not want medication changes at this time  Retired, Worked at [employer] for many years Very active with family, grandchildren  EKG personally reviewed by myself on todays visit Normal sinus rhythm with rate 76 bpm nonspecific T wave  abnormality anterolateral leads,  PVC  no change from previous EKG in 2020  Other past medical history   x-rays showing L2-L5 significant disc disease Previous renal dysfunction in the setting of NSAIDs and diuresis renal ultrasound 05/29/2011 showing no hydronephrosis  seen by renal service at [facility]     Past Medical History:  Diagnosis Date   AICD (automatic cardioverter/defibrillator) present    Arthritis    "some; treated w/ibuprofen" (06/19/2016)   Chronic kidney disease    Chronic lower back pain    Chronic systolic CHF (congestive heart failure) (HCC)    a. 10/2011 Cath: nl cors, EF 15-20%;  b. 02/2012 Echo: EF 25-30%; c. 02/2016 Echo: EF 20-25%, diff HK, mild MR, mildly dil LA.   COPD (chronic obstructive pulmonary disease) (HCC)    History of recurrent miscarriages, not currently pregnant    Hypertension    Morbid obesity (HCC)    NICM (nonischemic cardiomyopathy) (HCC)    a. 10/2011 Cath: nl cors, EF 15-20%;  b. 02/2012 Echo: EF 25-30%; c. 02/2016 Echo: EF 20-25%; d. 05/2016 s/p BSX Vigilant EL single lead AICD.   PVC's (premature ventricular contractions)    a. 01/2016 Holter: 22% PVC's w/ multiple morphologies with runs.   Tobacco abuse    Past Surgical History:  Procedure Laterality Date   CARDIAC CATHETERIZATION  11/19/2011   GANGLION CYST EXCISION Right    ICD IMPLANT N/A 06/19/2016   Procedure: ICD Implant;  Surgeon: Duke Salvia, MD;  Location: [facility] INVASIVE CV LAB;  Service: Cardiovascular;  Laterality: N/A;   LACERATION REPAIR  finger; s/p stitches   TOOTH EXTRACTION     x2    Current Outpatient Medications on File Prior to Visit  Medication Sig Dispense Refill   amiodarone (PACERONE) 200 MG tablet Take 1 tablet (200 mg) by mouth once every other day 30 tablet 11   aspirin 81 MG tablet Take 81 mg by mouth daily.     carvedilol (COREG) 6.25 MG tablet Take 1 tablet (6.25 mg total) by mouth 2 (two) times daily with a meal. TAKE 1 TABLET (6.25 MG TOTAL) BY MOUTH  2 (TWO) TIMES DAILY WITH A MEAL. 60 tablet 11   diphenhydrAMINE (BENADRYL) 25 mg capsule Take 25 mg by mouth daily.     Fluticasone-Salmeterol (ADVAIR) 250-50 MCG/DOSE AEPB Inhale 1 puff into the lungs as needed (for shortness of breath).      hydrALAZINE (APRESOLINE) 10 MG tablet Take 1 tablet (10 mg total) by mouth 3 (three) times daily. 90 tablet 11   ibuprofen (ADVIL,MOTRIN) 200 MG tablet Take 400 mg by mouth every 6 (six) hours as needed for headache or moderate pain.      isosorbide mononitrate (IMDUR) 30 MG 24 hr tablet Take 1 tablet (30 mg total) by mouth daily. 30 tablet 11   potassium chloride SA (KLOR-CON) 20 MEQ tablet Take 1 tablet (20 mEq total) by mouth 2 (two) times daily. 60 tablet 11   spironolactone (ALDACTONE) 25 MG tablet TAKE 1 TABLET BY MOUTH EVERY DAY 90 tablet 3   torsemide (DEMADEX) 20 MG tablet Take 1 tablet (20 mg total) by mouth daily. 30 tablet 11   No current facility-administered medications on file prior to visit.    Allergies:   Ace inhibitors, Losartan, and Penicillins   Social History   Tobacco Use   Smoking status: Every Day    Packs/day: 0.50    Years: 48.00    Pack years: 24.00    Types: Cigarettes   Smokeless tobacco: Never  Vaping Use   Vaping Use: Never used  Substance Use Topics   Alcohol use: Yes    Comment: 06/19/2016 "nothing in years"   Drug use: No     Family Hx: The patient's family history includes Hypertension in her father and mother.  ROS:   Please see the history of present illness.    Review of Systems  Constitutional: Negative.   Respiratory: Negative.    Cardiovascular: Negative.   Gastrointestinal: Negative.   Musculoskeletal: Negative.   Neurological: Negative.   Psychiatric/Behavioral: Negative.    All other systems reviewed and are negative.   Labs/Other Tests and Data Reviewed:    Recent Labs: 05/02/2020: Hemoglobin 13.4; Platelets 250 01/23/2021: ALT 15; BUN 22; Creatinine, Ser 1.21; Potassium 4.9; Sodium  142; TSH 0.561   Recent Lipid Panel Lab Results  Component Value Date/Time   CHOL 172 11/18/2011 04:55 AM   TRIG 89 11/18/2011 04:55 AM   HDL 74 (H) 11/18/2011 04:55 AM   LDLCALC 80 11/18/2011 04:55 AM    Wt Readings from Last 3 Encounters:  02/19/21 234 lb 8 oz (106.4 kg)  01/23/21 233 lb (105.7 kg)  08/01/20 234 lb 2 oz (106.2 kg)     Exam:    BP 130/76 (BP Location: Left Arm, Patient Position: Sitting, Cuff Size: Normal)    Pulse 76    Ht 5\' 7"  (1.702 m)    Wt 234 lb 8 oz (106.4 kg)    SpO2 99%    BMI 36.73 kg/m  Constitutional:  oriented to person, place,  and time. No distress.  HENT:  Head: Grossly normal Eyes:  no discharge. No scleral icterus.  Neck: No JVD, no carotid bruits  Cardiovascular: Regular rate and rhythm, no murmurs appreciated Pulmonary/Chest: Clear to auscultation bilaterally, no wheezes or rails Abdominal: Soft.  no distension.  no tenderness.  Musculoskeletal: Normal range of motion Neurological:  normal muscle tone. Coordination normal. No atrophy Skin: Skin warm and dry Psychiatric: normal affect, pleasant    ASSESSMENT & PLAN:    1. Nonischemic cardiomyopathy (HCC) Tolerating current medications, did not tolerate Jardiance Rash on ARB, cough on ACE Does not want Entresto, does not want medication changes in general, reports feeling fine Echocardiogram ejection fraction 35% CHF education today.  Missed several doses of her torsemide  2. NSVT (nonsustained ventricular tachycardia) (HCC) Pacer downloads reviewed, no arrhythmia Followed by Dr. Graciela Husbands  3. Chronic systolic heart failure (HCC) She does not want medication changes at this time, recent missing several doses of torsemide Weight stable, discussed monitoring weight, moderating fluid intake  4. ICD (implantable cardioverter-defibrillator) in place Device placed May 2018 Followed by Dr. Graciela Husbands  5. Frequent PVCs Asymptomatic, low PVC burden in 2022, asymptomatic  6. Claudication  (HCC) ABIs negative, just has chronic leg pain Recommend regular walking program  7. Essential hypertension Blood pressure stable, no medication changes made at her request    Total encounter time more than 25 minutes  Greater than 50% was spent in counseling and coordination of care with the patient   Signed, Julien Nordmann, MD  02/19/2021 9:43 AM    [facility] [address], [place], [place] [address]

## 2021-02-19 ENCOUNTER — Ambulatory Visit: Payer: Medicare HMO | Admitting: Cardiovascular Disease

## 2021-02-19 ENCOUNTER — Other Ambulatory Visit: Payer: Self-pay

## 2021-02-19 ENCOUNTER — Encounter: Payer: Self-pay | Admitting: Cardiovascular Disease

## 2021-02-19 VITALS — BP 130/76 | HR 76 | Ht 67.0 in | Wt 234.5 lb

## 2021-02-19 DIAGNOSIS — I428 Other cardiomyopathies: Secondary | ICD-10-CM

## 2021-02-19 DIAGNOSIS — I4729 Other ventricular tachycardia: Secondary | ICD-10-CM | POA: Diagnosis not present

## 2021-02-19 DIAGNOSIS — I5022 Chronic systolic (congestive) heart failure: Secondary | ICD-10-CM | POA: Diagnosis not present

## 2021-02-19 DIAGNOSIS — I509 Heart failure, unspecified: Secondary | ICD-10-CM

## 2021-02-19 DIAGNOSIS — I10 Essential (primary) hypertension: Secondary | ICD-10-CM

## 2021-02-19 DIAGNOSIS — I493 Ventricular premature depolarization: Secondary | ICD-10-CM | POA: Diagnosis not present

## 2021-02-19 DIAGNOSIS — Z95810 Presence of automatic (implantable) cardiac defibrillator: Secondary | ICD-10-CM

## 2021-02-19 NOTE — Patient Instructions (Addendum)
Medication Instructions:  No changes  If you need a refill on your cardiac medications before your next appointment, please call your pharmacy.   Lab work: No new labs needed  Testing/Procedures: No new testing needed  Follow-Up: At [facility], you and your health needs are our priority.  As part of our continuing mission to provide you with exceptional heart care, we have created designated Provider Care Teams.  These Care Teams include your primary Cardiologist (physician) and Advanced Practice Providers (APPs -  Physician Assistants and Nurse Practitioners) who all work together to provide you with the care you need, when you need it.  You will need a follow up appointment in 9 months with Dr. Rockey Situ Follow up with Dr. [name] as scheduled in June 2023  Providers on your designated Care Team:   Murray Hodgkins, NP Christell Faith, PA-C Cadence Kathlen Mody, [place]  COVID-19 Vaccine Information can be found at: [email or web address] For questions related to vaccine distribution or appointments, please email [email or web address] or call [phone].

## 2021-04-06 ENCOUNTER — Ambulatory Visit (INDEPENDENT_AMBULATORY_CARE_PROVIDER_SITE_OTHER): Payer: Medicare HMO

## 2021-04-06 DIAGNOSIS — I428 Other cardiomyopathies: Secondary | ICD-10-CM | POA: Diagnosis not present

## 2021-04-06 LAB — CUP PACEART REMOTE DEVICE CHECK
Battery Remaining Longevity: 144 mo
Battery Remaining Percentage: 100 %
Brady Statistic RV Percent Paced: 0 %
Date Time Interrogation Session: 20230310004000
HighPow Impedance: 85 Ohm
Implantable Lead Implant Date: 20180523
Implantable Lead Location: 753860
Implantable Lead Model: 293
Implantable Lead Serial Number: 430811
Implantable Pulse Generator Implant Date: 20180523
Lead Channel Impedance Value: 714 Ohm
Lead Channel Setting Pacing Amplitude: 2.5 V
Lead Channel Setting Pacing Pulse Width: 0.4 ms
Lead Channel Setting Sensing Sensitivity: 0.5 mV
Pulse Gen Serial Number: 225390

## 2021-04-11 NOTE — Progress Notes (Signed)
Remote ICD transmission.   

## 2021-05-03 ENCOUNTER — Other Ambulatory Visit: Payer: Self-pay | Admitting: Internal Medicine

## 2021-05-03 DIAGNOSIS — I493 Ventricular premature depolarization: Secondary | ICD-10-CM

## 2021-05-03 DIAGNOSIS — Z79899 Other long term (current) drug therapy: Secondary | ICD-10-CM

## 2021-07-06 ENCOUNTER — Ambulatory Visit (INDEPENDENT_AMBULATORY_CARE_PROVIDER_SITE_OTHER): Payer: Medicare HMO

## 2021-07-06 DIAGNOSIS — I428 Other cardiomyopathies: Secondary | ICD-10-CM | POA: Diagnosis not present

## 2021-07-06 LAB — CUP PACEART REMOTE DEVICE CHECK
Battery Remaining Longevity: 144 mo
Battery Remaining Percentage: 100 %
Brady Statistic RV Percent Paced: 0 %
Date Time Interrogation Session: 20230609004100
HighPow Impedance: 73 Ohm
Implantable Lead Implant Date: 20180523
Implantable Lead Location: 753860
Implantable Lead Model: 293
Implantable Lead Serial Number: 430811
Implantable Pulse Generator Implant Date: 20180523
Lead Channel Impedance Value: 681 Ohm
Lead Channel Setting Pacing Amplitude: 2.5 V
Lead Channel Setting Pacing Pulse Width: 0.4 ms
Lead Channel Setting Sensing Sensitivity: 0.5 mV
Pulse Gen Serial Number: 225390

## 2021-07-13 NOTE — Progress Notes (Signed)
Remote ICD transmission.   

## 2021-07-24 ENCOUNTER — Encounter: Payer: Self-pay | Admitting: Internal Medicine

## 2021-07-24 ENCOUNTER — Ambulatory Visit (INDEPENDENT_AMBULATORY_CARE_PROVIDER_SITE_OTHER): Payer: Medicare HMO | Admitting: Internal Medicine

## 2021-07-24 ENCOUNTER — Other Ambulatory Visit
Admission: RE | Admit: 2021-07-24 | Discharge: 2021-07-24 | Disposition: A | Discharge status: Home / Self Care | Payer: Medicare HMO | Source: Ambulatory Visit | Attending: Internal Medicine | Admitting: Internal Medicine

## 2021-07-24 DIAGNOSIS — I493 Ventricular premature depolarization: Secondary | ICD-10-CM | POA: Diagnosis not present

## 2021-07-24 DIAGNOSIS — Z79899 Other long term (current) drug therapy: Secondary | ICD-10-CM | POA: Diagnosis present

## 2021-07-24 DIAGNOSIS — I4729 Other ventricular tachycardia: Secondary | ICD-10-CM | POA: Diagnosis not present

## 2021-07-24 DIAGNOSIS — I428 Other cardiomyopathies: Secondary | ICD-10-CM

## 2021-07-24 DIAGNOSIS — Z95810 Presence of automatic (implantable) cardiac defibrillator: Secondary | ICD-10-CM

## 2021-07-24 DIAGNOSIS — I5022 Chronic systolic (congestive) heart failure: Secondary | ICD-10-CM | POA: Diagnosis not present

## 2021-07-24 DIAGNOSIS — I10 Essential (primary) hypertension: Secondary | ICD-10-CM

## 2021-07-24 LAB — CUP PACEART INCLINIC DEVICE CHECK
Date Time Interrogation Session: 20230627165201
HighPow Impedance: 79 Ohm
Implantable Lead Implant Date: 20180523
Implantable Lead Location: 753860
Implantable Lead Model: 293
Implantable Lead Serial Number: 430811
Implantable Pulse Generator Implant Date: 20180523
Lead Channel Impedance Value: 722 Ohm
Lead Channel Pacing Threshold Amplitude: 1.4 V
Lead Channel Pacing Threshold Pulse Width: 0.4 ms
Lead Channel Sensing Intrinsic Amplitude: 12.7 mV
Lead Channel Setting Pacing Amplitude: 2.5 V
Lead Channel Setting Pacing Pulse Width: 0.4 ms
Lead Channel Setting Sensing Sensitivity: 0.5 mV
Pulse Gen Serial Number: 225390

## 2021-07-24 LAB — COMPREHENSIVE METABOLIC PANEL
ALT: 19 U/L (ref 0–44)
AST: 18 U/L (ref 15–41)
Albumin: 4 g/dL (ref 3.5–5.0)
Alkaline Phosphatase: 73 U/L (ref 38–126)
Anion gap: 9 (ref 5–15)
BUN: 24 mg/dL — ABNORMAL HIGH (ref 8–23)
CO2: 26 mmol/L (ref 22–32)
Calcium: 9.9 mg/dL (ref 8.9–10.3)
Chloride: 108 mmol/L (ref 98–111)
Creatinine, Ser: 1.45 mg/dL — ABNORMAL HIGH (ref 0.44–1.00)
GFR, Estimated: 40 mL/min — ABNORMAL LOW (ref >60)
Glucose, Bld: 108 mg/dL — ABNORMAL HIGH (ref 70–99)
Potassium: 4.4 mmol/L (ref 3.5–5.1)
Sodium: 143 mmol/L (ref 135–145)
Total Bilirubin: 1 mg/dL (ref 0.3–1.2)
Total Protein: 8.2 g/dL — ABNORMAL HIGH (ref 6.5–8.1)

## 2021-07-24 LAB — PACEMAKER DEVICE OBSERVATION

## 2021-07-24 LAB — TSH: TSH: 0.586 u[IU]/mL (ref 0.350–4.500)

## 2021-07-24 NOTE — Progress Notes (Signed)
Patient Care Team: Dione Housekeeper, MD as PCP - General [name], MD as PCP - Cardiology (Cardiology) Duke Salvia, MD as PCP - Electrophysiology (Cardiology)   HPI  Charlene Schmitt is a 66 y.o. female seen in followup for ICD-[employer] implanted 5/18 for primary prevention in  the setting of a persistent nonischemic cardiomyopathy and modest congestive heart failure and complex frequent ventricular ectopy now treated with amio.  It has been our impression that the myopathy preceded the ectopy.   She is on beta-blockers, RAAS blockers and hydralazine/nitrates.  Rash on losartan & has declined entresto in the past.    The patient denies chest pain,  nocturnal dyspnea, orthopnea or peripheral edema.  There have been no palpitations, lightheadedness or syncope.  Complains of shortness of breath with moderate effort, chasing great-grandchildren etc.  Patient denies symptoms of respiratory, GI intolerance, sun sensitivity, neurological symptoms attributable to amiodarone.        Date % PVCs   1/18 22% multiple morphologies & runs  4/22 2.8%           DATE TEST  EF    10/13 CATH  15-20% NORMAL CAs  2/14 ECHO    25-30 %    2/18 ECHO  25%    4/18 Echo  25-30%     4/22  Echo  30-35%       Date TSH LFTs Hgb Cr K  5/ 18  0.659 13   1.34 4.3  9/21 0.614 16  13.5 1.34 4.7  4/22 0.797 20 13.4 1.37 4.8  12.22 0.561 15  1.21 4.9     Dyspnea on exertion. SGLT-2 ( 4/22) did not tolerate.     Current Outpatient Medications on File Prior to Visit  Medication Sig Dispense Refill   amiodarone (PACERONE) 200 MG tablet TAKE 1 TABLET (200 MG) BY MOUTH ONCE EVERY OTHER DAY 15 tablet 2   aspirin 81 MG tablet Take 81 mg by mouth daily.     carvedilol (COREG) 6.25 MG tablet Take 1 tablet (6.25 mg total) by mouth 2 (two) times daily with a meal. TAKE 1 TABLET (6.25 MG TOTAL) BY MOUTH 2 (TWO) TIMES DAILY WITH A MEAL. 60 tablet 11   diphenhydrAMINE (BENADRYL) 25  mg capsule Take 25 mg by mouth daily.     Fluticasone-Salmeterol (ADVAIR) 250-50 MCG/DOSE AEPB Inhale 1 puff into the lungs as needed (for shortness of breath).      hydrALAZINE (APRESOLINE) 10 MG tablet Take 1 tablet (10 mg total) by mouth 3 (three) times daily. 90 tablet 11   ibuprofen (ADVIL,MOTRIN) 200 MG tablet Take 400 mg by mouth every 6 (six) hours as needed for headache or moderate pain.      isosorbide mononitrate (IMDUR) 30 MG 24 hr tablet Take 1 tablet (30 mg total) by mouth daily. 30 tablet 11   potassium chloride SA (KLOR-CON) 20 MEQ tablet Take 1 tablet (20 mEq total) by mouth 2 (two) times daily. 60 tablet 11   spironolactone (ALDACTONE) 25 MG tablet TAKE 1 TABLET BY MOUTH EVERY DAY 90 tablet 3   torsemide (DEMADEX) 20 MG tablet Take 1 tablet (20 mg total) by mouth daily. 30 tablet 11   No current facility-administered medications on file prior to visit.     Physical Exam BP 120/82   Pulse 66   Ht 5\' 6"  (1.676 m)   Wt 228 lb 9.6 oz (103.7 kg)   SpO2 95%   BMI  36.90 kg/m  Well developed and well nourished in no acute distress HENT normal Neck supple with JVP-flat Clear Device pocket well healed; without hematoma or erythema.  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  We will continue at 200 mg every other day.  Tolerating.  Needs amiodarone surveillance laboratories.   For cardiomyopathy we  will continue guideline directed therapy with carvedilol 6.25 spironolactone 25 and hydralazine nitrates.  She has declined in the past ARB's and Entresto.  Continues to smoke.  Again encouraged to stop.  Euvolemic.  We will continue her Demadex

## 2021-08-13 ENCOUNTER — Encounter: Payer: Self-pay | Admitting: Internal Medicine

## 2021-09-14 ENCOUNTER — Other Ambulatory Visit: Payer: Self-pay | Admitting: Cardiovascular Disease

## 2021-09-14 DIAGNOSIS — I493 Ventricular premature depolarization: Secondary | ICD-10-CM

## 2021-09-14 DIAGNOSIS — Z79899 Other long term (current) drug therapy: Secondary | ICD-10-CM

## 2021-09-15 ENCOUNTER — Other Ambulatory Visit: Payer: Self-pay | Admitting: Cardiovascular Disease

## 2021-09-15 DIAGNOSIS — I5022 Chronic systolic (congestive) heart failure: Secondary | ICD-10-CM

## 2021-09-15 DIAGNOSIS — I428 Other cardiomyopathies: Secondary | ICD-10-CM

## 2021-10-05 ENCOUNTER — Ambulatory Visit (INDEPENDENT_AMBULATORY_CARE_PROVIDER_SITE_OTHER): Payer: Medicare HMO

## 2021-10-05 DIAGNOSIS — I428 Other cardiomyopathies: Secondary | ICD-10-CM | POA: Diagnosis not present

## 2021-10-09 LAB — CUP PACEART REMOTE DEVICE CHECK
Battery Remaining Longevity: 138 mo
Battery Remaining Percentage: 96 %
Brady Statistic RV Percent Paced: 0 %
Date Time Interrogation Session: 20230908004100
HighPow Impedance: 83 Ohm
Implantable Lead Implant Date: 20180523
Implantable Lead Location: 753860
Implantable Lead Model: 293
Implantable Lead Serial Number: 430811
Implantable Pulse Generator Implant Date: 20180523
Lead Channel Impedance Value: 669 Ohm
Lead Channel Setting Pacing Amplitude: 2.5 V
Lead Channel Setting Pacing Pulse Width: 0.4 ms
Lead Channel Setting Sensing Sensitivity: 0.5 mV
Pulse Gen Serial Number: 225390

## 2021-10-20 NOTE — Progress Notes (Signed)
Remote ICD transmission.   

## 2021-11-15 NOTE — Progress Notes (Signed)
Date:  11/16/2021   ID:  Charlene Schmitt, DOB 11-19-1955, MRN [id number]  Patient Location:  [place] [address]   Provider location:   [facility], [place] office  PCP:  [name], MD  Cardiologist:  [name] [facility]   Chief Complaint  Patient presents with   9 month follow up     "Doing well." Medications reviewed by the patient verbally.     History of Present Illness:    Charlene Schmitt is a 66 y.o. female  past medical history of obesity,  hypertension,  nonischemic cardiomyopathy,ICD placed in 05/2016 ejection fraction less than 25%, In 2018 cardiac catheterization showing no significant coronary artery disease, normal right ventricular systolic pressures on echocardiogram.  Smoker  PVCs, on amiodarone Ejection fraction 30 up to 35% in April 2022 She presents for routine follow up of her cardiomyopathy.  Last seen in clinic by myself January 2023 Seen by EP June 2023  Seen by Dr. [name] EP December 2022 PVCs down to 2.8% as of April 2022 Ejection fraction at that time 30-35% Reported she was smoking at that time On amiodarone 200 every other day, torsemide 20 daily  ICD download reviewed, no significant arrhythmia   On carvedilol, isosorbide, hydralazine, spironolactone Torsemide 20mg   1/2 tab daily or QOD Does not want additional pill changes at this time  Continues to smoke, 1/2 ppd unsuccessful in her attempts to quit smoking  2 great grandchildren Active, plays, cooks  No SOB, walks well, through [employer]  Chronic hip and knee pain, prior cortisone shots No regular exercise program, but active with chores  medication intolerances Rash on losartan, cough on ACE inhibitor Not on Entresto presumably secondary to ARB rash Stomach upset on jardiance, had to stop  EKG personally reviewed by myself on todays visit Normal sinus rhythm rate 78 bpm T wave inversions V3 through V6 1 and  aVL  Other past medical history   x-rays showing L2-L5 significant disc disease Previous renal dysfunction in the setting of NSAIDs and diuresis renal ultrasound 05/29/2011 showing no hydronephrosis  seen by [facility] service at [facility]     Past Medical History:  Diagnosis Date   AICD (automatic cardioverter/defibrillator) present    Arthritis    "some; treated w/ibuprofen" (06/19/2016)   Chronic kidney disease    Chronic lower back pain    Chronic systolic CHF (congestive heart failure) ([place])    a. 10/2011 Cath: nl cors, EF 15-20%;  b. 02/2012 Echo: EF 25-30%; c. 02/2016 Echo: EF 20-25%, diff HK, mild MR, mildly dil LA.   COPD (chronic obstructive pulmonary disease) ([place])    History of recurrent miscarriages, not currently pregnant    Hypertension    Morbid obesity ([place])    NICM (nonischemic cardiomyopathy) ([place])    a. 10/2011 Cath: nl cors, EF 15-20%;  b. 02/2012 Echo: EF 25-30%; c. 02/2016 Echo: EF 20-25%; d. 05/2016 s/p BSX Vigilant EL single lead AICD.   PVC's (premature ventricular contractions)    a. 01/2016 Holter: 22% PVC's w/ multiple morphologies with runs.   Tobacco abuse    Past Surgical History:  Procedure Laterality Date   CARDIAC CATHETERIZATION  11/19/2011   GANGLION CYST EXCISION Right    ICD IMPLANT N/A 06/19/2016   Procedure: ICD Implant;  Surgeon: Deboraha Sprang, MD;  Location: [place] CV LAB;  Service: Cardiovascular;  Laterality: N/A;   LACERATION REPAIR     finger; s/p stitches  TOOTH EXTRACTION     x2    Current Outpatient Medications on File Prior to Visit  Medication Sig Dispense Refill   amiodarone (PACERONE) 200 MG tablet TAKE 1 TABLET (200 MG) BY MOUTH ONCE EVERY OTHER DAY 45 tablet 1   aspirin 81 MG tablet Take 81 mg by mouth daily.     carvedilol (COREG) 6.25 MG tablet Take 1 tablet (6.25 mg total) by mouth 2 (two) times daily with a meal. TAKE 1 TABLET (6.25 MG TOTAL) BY MOUTH 2 (TWO) TIMES DAILY WITH A MEAL. 60 tablet 11   diphenhydrAMINE  (BENADRYL) 25 mg capsule Take 25 mg by mouth daily.     Fluticasone-Salmeterol (ADVAIR) 250-50 MCG/DOSE AEPB Inhale 1 puff into the lungs as needed (for shortness of breath).      hydrALAZINE (APRESOLINE) 10 MG tablet Take 1 tablet (10 mg total) by mouth 3 (three) times daily. 90 tablet 11   ibuprofen (ADVIL,MOTRIN) 200 MG tablet Take 400 mg by mouth every 6 (six) hours as needed for headache or moderate pain.      isosorbide mononitrate (IMDUR) 30 MG 24 hr tablet Take 1 tablet (30 mg total) by mouth daily. PLEASE SCHEDULE OFFICE VISIT FOR FURTHER REFILLS. THANK YOU! 90 tablet 0   potassium chloride SA (KLOR-CON) 20 MEQ tablet Take 1 tablet (20 mEq total) by mouth 2 (two) times daily. 60 tablet 11   spironolactone (ALDACTONE) 25 MG tablet TAKE 1 TABLET BY MOUTH EVERY DAY 90 tablet 3   torsemide (DEMADEX) 20 MG tablet Take 1 tablet (20 mg total) by mouth daily. 30 tablet 11   No current facility-administered medications on file prior to visit.    Allergies:   Ace inhibitors, Losartan, and Penicillins   Social History   Tobacco Use   Smoking status: Every Day    Packs/day: 0.50    Years: 48.00    Total pack years: 24.00    Types: Cigarettes   Smokeless tobacco: Never  Vaping Use   Vaping Use: Never used  Substance Use Topics   Alcohol use: Yes    Comment: 06/19/2016 "nothing in years"   Drug use: No     Family Hx: The patient's family history includes Hypertension in her father and mother.  ROS:   Please see the history of present illness.    Review of Systems  Constitutional: Negative.   Respiratory: Negative.    Cardiovascular: Negative.   Gastrointestinal: Negative.   Musculoskeletal: Negative.   Neurological: Negative.   Psychiatric/Behavioral: Negative.    All other systems reviewed and are negative.    Labs/Other Tests and Data Reviewed:    Recent Labs: 07/24/2021: ALT 19; BUN 24; Creatinine, Ser 1.45; Potassium 4.4; Sodium 143; TSH 0.586   Recent Lipid  Panel Lab Results  Component Value Date/Time   CHOL 172 11/18/2011 04:55 AM   TRIG 89 11/18/2011 04:55 AM   HDL 74 (H) 11/18/2011 04:55 AM   LDLCALC 80 11/18/2011 04:55 AM    Wt Readings from Last 3 Encounters:  11/16/21 224 lb 6 oz (101.8 kg)  07/24/21 228 lb 9.6 oz (103.7 kg)  02/19/21 234 lb 8 oz (106.4 kg)     Exam:    BP 100/64 (BP Location: Left Arm, Patient Position: Sitting, Cuff Size: Large)   Pulse 78   Ht 5\' 7"  (1.702 m)   Wt 224 lb 6 oz (101.8 kg)   SpO2 97%   BMI 35.14 kg/m  Constitutional:  oriented to person, place, and time.  No distress.  HENT:  Head: Grossly normal Eyes:  no discharge. No scleral icterus.  Neck: No JVD, no carotid bruits  Cardiovascular: Regular rate and rhythm, no murmurs appreciated Pulmonary/Chest: Clear to auscultation bilaterally, no wheezes or rails Abdominal: Soft.  no distension.  no tenderness.  Musculoskeletal: Normal range of motion Neurological:  normal muscle tone. Coordination normal. No atrophy Skin: Skin warm and dry Psychiatric: normal affect, pleasant   ASSESSMENT & PLAN:    1. Nonischemic cardiomyopathy (HCC) Tolerating current medications,  She does not want medication changes at this time did not tolerate Jardiance Rash on ARB, cough on ACE Does not want Entresto,  Echocardiogram ejection fraction 35% Takes half dose torsemide daily or every other day, denies fluid retention Blood pressure low, denies orthostasis symptoms  2. NSVT (nonsustained ventricular tachycardia) (HCC) Pacer downloads reviewed, no sick evidence arrhythmia Followed by Dr. Graciela Husbands  3. Chronic systolic heart failure (HCC) Appears euvolemic, no medication changes made  4. ICD (implantable cardioverter-defibrillator) in place Device placed May 2018 Followed by Dr. Graciela Husbands  5. Frequent PVCs Asymptomatic, low PVC burden in 2022, asymptomatic On amiodarone, balance lab work June 2023  6. Claudication (HCC) chronic leg pain, ABIs  normal Active at baseline, denies significant symptoms  7. Essential hypertension Blood pressure is well controlled on today's visit. No changes made to the medications.    Total encounter time more than 30 minutes  Greater than 50% was spent in counseling and coordination of care with the patient   Signed, Julien Nordmann, MD  11/16/2021 10:14 AM    [facility] [address], [place], [place] [address]

## 2021-11-16 ENCOUNTER — Ambulatory Visit: Payer: Medicare HMO | Attending: Cardiovascular Disease | Admitting: Cardiovascular Disease

## 2021-11-16 ENCOUNTER — Encounter: Payer: Self-pay | Admitting: Cardiovascular Disease

## 2021-11-16 VITALS — BP 100/64 | HR 78 | Ht 67.0 in | Wt 224.4 lb

## 2021-11-16 DIAGNOSIS — I10 Essential (primary) hypertension: Secondary | ICD-10-CM

## 2021-11-16 DIAGNOSIS — I428 Other cardiomyopathies: Secondary | ICD-10-CM | POA: Diagnosis not present

## 2021-11-16 DIAGNOSIS — I4729 Other ventricular tachycardia: Secondary | ICD-10-CM

## 2021-11-16 DIAGNOSIS — Z95810 Presence of automatic (implantable) cardiac defibrillator: Secondary | ICD-10-CM

## 2021-11-16 DIAGNOSIS — I5022 Chronic systolic (congestive) heart failure: Secondary | ICD-10-CM

## 2021-11-16 MED ORDER — ISOSORBIDE MONONITRATE ER 30 MG PO TB24: 30.0000 mg | ORAL_TABLET | Freq: Every day | ORAL | 3 refills | Status: DC

## 2021-11-16 NOTE — Patient Instructions (Addendum)
Medication Instructions:  No changes  If you need a refill on your cardiac medications before your next appointment, please call your pharmacy.   Lab work: No new labs needed  Testing/Procedures: No new testing needed  Follow-Up: At [facility], you and your health needs are our priority.  As part of our continuing mission to provide you with exceptional heart care, we have created designated Provider Care Teams.  These Care Teams include your primary Cardiologist (physician) and Advanced Practice Providers (APPs -  Physician Assistants and Nurse Practitioners) who all work together to provide you with the care you need, when you need it.  You will need a follow up appointment in 12 months  Providers on your designated Care Team:   Christopher Berge, NP Ryan Dunn, PA-C Cadence Furth, PA-C  COVID-19 Vaccine Information can be found at: [email or web address] For questions related to vaccine distribution or appointments, please email [email or web address] or call [phone].   

## 2021-12-11 ENCOUNTER — Other Ambulatory Visit: Payer: Self-pay | Admitting: Cardiovascular Disease

## 2021-12-11 DIAGNOSIS — I493 Ventricular premature depolarization: Secondary | ICD-10-CM

## 2021-12-11 DIAGNOSIS — Z79899 Other long term (current) drug therapy: Secondary | ICD-10-CM

## 2021-12-15 ENCOUNTER — Other Ambulatory Visit: Payer: Self-pay | Admitting: Cardiovascular Disease

## 2021-12-15 DIAGNOSIS — I5022 Chronic systolic (congestive) heart failure: Secondary | ICD-10-CM

## 2021-12-15 DIAGNOSIS — I428 Other cardiomyopathies: Secondary | ICD-10-CM

## 2021-12-19 ENCOUNTER — Other Ambulatory Visit: Payer: Self-pay | Admitting: Cardiovascular Disease

## 2021-12-19 DIAGNOSIS — I428 Other cardiomyopathies: Secondary | ICD-10-CM

## 2021-12-19 DIAGNOSIS — I5022 Chronic systolic (congestive) heart failure: Secondary | ICD-10-CM

## 2021-12-25 ENCOUNTER — Ambulatory Visit: Payer: Medicare HMO | Attending: Internal Medicine | Admitting: Internal Medicine

## 2021-12-25 ENCOUNTER — Other Ambulatory Visit
Admission: RE | Admit: 2021-12-25 | Discharge: 2021-12-25 | Disposition: A | Discharge status: Home / Self Care | Payer: Medicare HMO | Source: Ambulatory Visit | Attending: Internal Medicine | Admitting: Internal Medicine

## 2021-12-25 ENCOUNTER — Encounter: Payer: Self-pay | Admitting: Internal Medicine

## 2021-12-25 VITALS — BP 116/78 | HR 86 | Ht 67.0 in | Wt 228.5 lb

## 2021-12-25 DIAGNOSIS — I493 Ventricular premature depolarization: Secondary | ICD-10-CM | POA: Diagnosis present

## 2021-12-25 DIAGNOSIS — Z79899 Other long term (current) drug therapy: Secondary | ICD-10-CM | POA: Diagnosis present

## 2021-12-25 DIAGNOSIS — I5022 Chronic systolic (congestive) heart failure: Secondary | ICD-10-CM | POA: Diagnosis not present

## 2021-12-25 DIAGNOSIS — I4729 Other ventricular tachycardia: Secondary | ICD-10-CM

## 2021-12-25 DIAGNOSIS — Z95810 Presence of automatic (implantable) cardiac defibrillator: Secondary | ICD-10-CM | POA: Diagnosis not present

## 2021-12-25 DIAGNOSIS — I428 Other cardiomyopathies: Secondary | ICD-10-CM | POA: Diagnosis not present

## 2021-12-25 LAB — CUP PACEART INCLINIC DEVICE CHECK
Date Time Interrogation Session: 20231128125240
HighPow Impedance: 78 Ohm
Implantable Lead Connection Status: 753985
Implantable Lead Implant Date: 20180523
Implantable Lead Location: 753860
Implantable Lead Model: 293
Implantable Lead Serial Number: 430811
Implantable Pulse Generator Implant Date: 20180523
Lead Channel Impedance Value: 644 Ohm
Lead Channel Pacing Threshold Amplitude: 1.4 V
Lead Channel Pacing Threshold Pulse Width: 0.4 ms
Lead Channel Sensing Intrinsic Amplitude: 15.3 mV
Lead Channel Setting Pacing Amplitude: 2.5 V
Lead Channel Setting Pacing Pulse Width: 0.4 ms
Lead Channel Setting Sensing Sensitivity: 0.5 mV
Pulse Gen Serial Number: 225390
Zone Setting Status: 755011

## 2021-12-25 LAB — COMPREHENSIVE METABOLIC PANEL
ALT: 20 U/L (ref 0–44)
AST: 19 U/L (ref 15–41)
Albumin: 3.5 g/dL (ref 3.5–5.0)
Alkaline Phosphatase: 75 U/L (ref 38–126)
Anion gap: 5 (ref 5–15)
BUN: 20 mg/dL (ref 8–23)
CO2: 26 mmol/L (ref 22–32)
Calcium: 9.5 mg/dL (ref 8.9–10.3)
Chloride: 110 mmol/L (ref 98–111)
Creatinine, Ser: 1.08 mg/dL — ABNORMAL HIGH (ref 0.44–1.00)
GFR, Estimated: 56 mL/min — ABNORMAL LOW (ref >60)
Glucose, Bld: 100 mg/dL — ABNORMAL HIGH (ref 70–99)
Potassium: 4 mmol/L (ref 3.5–5.1)
Sodium: 141 mmol/L (ref 135–145)
Total Bilirubin: 0.8 mg/dL (ref 0.3–1.2)
Total Protein: 7.1 g/dL (ref 6.5–8.1)

## 2021-12-25 LAB — TSH: TSH: 0.602 u[IU]/mL (ref 0.350–4.500)

## 2021-12-25 NOTE — Progress Notes (Signed)
Patient Care Team: Dione Housekeeper, MD as PCP - General [name], MD as PCP - Cardiology (Cardiology) Duke Salvia, MD as PCP - Electrophysiology (Cardiology)   HPI  Charlene Schmitt is a 66 y.o. female seen in followup for ICD-[employer] implanted 5/18 for primary prevention in  the setting of a persistent nonischemic cardiomyopathy and modest congestive heart failure and complex frequent ventricular ectopy now treated with amio.  It has been our impression that the myopathy preceded the ectopy.   She is on beta-blockers, RAAS blockers and hydralazine/nitrates.  Rash on losartan & has declined entresto in the past.   The patient denies chest pain, shortness of breath, nocturnal dyspnea, orthopnea or peripheral edema.  There have been no palpitations, lightheadedness or syncope.  A little bit of abdominal fullness at the end of Thanksgiving.  Ate well.  Son, Dickie La, is apparently a great cook  Patient denies symptoms of respiratory, GI intolerance, sun sensitivity, neurological symptoms attributable to amiodarone.     Number    Date % PVCs   1/18 22% multiple morphologies & runs  4/22 2.8%           DATE TEST  EF    10/13 CATH  15-20% NORMAL CAs  2/14 ECHO    25-30 %    2/18 ECHO  25%    4/18 Echo  25-30%     4/22  Echo  30-35%       Date TSH LFTs Hgb Cr K  5/ 18  0.659 13   1.34 4.3  9/21 0.614 16  13.5 1.34 4.7  4/22 0.797 20 13.4 1.37 4.8  12.22 0.561 15  1.21 4.9  6/23 0.586 19  1.45 4.4     Dyspnea on exertion. SGLT-2 ( 4/22) did not tolerate.     Current Outpatient Medications on File Prior to Visit  Medication Sig Dispense Refill   amiodarone (PACERONE) 200 MG tablet TAKE 1 TABLET (200 MG) BY MOUTH ONCE EVERY OTHER DAY 45 tablet 1   aspirin 81 MG tablet Take 81 mg by mouth daily.     carvedilol (COREG) 6.25 MG tablet TAKE 1 TABLET BY MOUTH 2 TIMES DAILY WITH A MEAL. TAKE 1 TABLET BY MOUTH 2 TIMES DAILY WITH A MEAL. 180 tablet 3    diphenhydrAMINE (BENADRYL) 25 mg capsule Take 25 mg by mouth daily.     Fluticasone-Salmeterol (ADVAIR) 250-50 MCG/DOSE AEPB Inhale 1 puff into the lungs as needed (for shortness of breath).      hydrALAZINE (APRESOLINE) 10 MG tablet TAKE 1 TABLET BY MOUTH THREE TIMES A DAY 90 tablet 11   ibuprofen (ADVIL,MOTRIN) 200 MG tablet Take 400 mg by mouth every 6 (six) hours as needed for headache or moderate pain.      isosorbide mononitrate (IMDUR) 30 MG 24 hr tablet Take 1 tablet (30 mg total) by mouth daily. 90 tablet 3   KLOR-CON M20 20 MEQ tablet TAKE 1 TABLET BY MOUTH TWICE A DAY 60 tablet 11   spironolactone (ALDACTONE) 25 MG tablet TAKE 1 TABLET (25 MG TOTAL) BY MOUTH DAILY. 30 tablet 11   torsemide (DEMADEX) 20 MG tablet TAKE 1 TABLET BY MOUTH EVERY DAY 90 tablet 3   No current facility-administered medications on file prior to visit.     Physical Exam BP 116/78 (BP Location: Left Arm, Patient Position: Sitting, Cuff Size: Large)   Pulse 86   Ht 5\' 7"  (1.702 m)   Wt  228 lb 8 oz (103.6 kg)   SpO2 98%   BMI 35.79 kg/m  Well developed and well nourished in no acute distress HENT normal Neck supple with JVP-flat Clear Device pocket well healed; without hematoma or erythema.  There is no tethering  Regular rate and rhythm, no  gallop No  murmur Abd-soft with active BS No Clubbing cyanosis  edema Skin-warm and dry A & Oriented  Grossly normal sensory and motor function  ECG sinus at 86 Intervals 18/10/37 Nonspecific T wave flattening  Device function is normal. 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  Continue amiodarone at 200 mg every other day.  Blood pressure well controlled.  We will continue hydralazine isosorbide spironolactone and carvedilol.  Intolerant of ACE/ARB  Still smoking.  Encouraged her to stop.  Euvolemic.  She takes her Demadex mostly as needed.  We will continue 20 mg.  Check amiodarone surveillance laboratories

## 2021-12-25 NOTE — Patient Instructions (Signed)
Medication Instructions:  - Your physician recommends that you continue on your current medications as directed. Please refer to the Current Medication list given to you today.  *If you need a refill on your cardiac medications before your next appointment, please call your pharmacy*   Lab Work: - Your physician recommends that you have lab work today/ at your convenience  CMET/ TSH  [facility] Entrance at [facility] 1st desk on the right to check in (REGISTRATION)  Lab hours: Monday- Friday (7:30 am- 5:30 pm)   If you have labs (blood work) drawn today and your tests are completely normal, you will receive your results only by: MyChart Message (if you have MyChart) OR A paper copy in the mail If you have any lab test that is abnormal or we need to change your treatment, we will call you to review the results.   Testing/Procedures: - none ordered   Follow-Up: At [facility], you and your health needs are our priority.  As part of our continuing mission to provide you with exceptional heart care, we have created designated Provider Care Teams.  These Care Teams include your primary Cardiologist (physician) and Advanced Practice Providers (APPs -  Physician Assistants and Nurse Practitioners) who all work together to provide you with the care you need, when you need it.  We recommend signing up for the patient portal called "MyChart".  Sign up information is provided on this After Visit Summary.  MyChart is used to connect with patients for Virtual Visits (Telemedicine).  Patients are able to view lab/test results, encounter notes, upcoming appointments, etc.  Non-urgent messages can be sent to your provider as well.   To learn more about what you can do with MyChart, go to [email or web address].    Your next appointment:   6 month(s)  The format for your next appointment:   In Person  Provider:   Sherryl Manges, MD    Other Instructions N/a  Important Information  About Sugar

## 2022-01-04 ENCOUNTER — Ambulatory Visit (INDEPENDENT_AMBULATORY_CARE_PROVIDER_SITE_OTHER): Payer: Medicare HMO

## 2022-01-04 DIAGNOSIS — I428 Other cardiomyopathies: Secondary | ICD-10-CM | POA: Diagnosis not present

## 2022-01-04 LAB — CUP PACEART REMOTE DEVICE CHECK
Battery Remaining Longevity: 126 mo
Battery Remaining Percentage: 86 %
Brady Statistic RV Percent Paced: 0 %
Date Time Interrogation Session: 20231208004100
HighPow Impedance: 80 Ohm
Implantable Lead Connection Status: 753985
Implantable Lead Implant Date: 20180523
Implantable Lead Location: 753860
Implantable Lead Model: 293
Implantable Lead Serial Number: 430811
Implantable Pulse Generator Implant Date: 20180523
Lead Channel Impedance Value: 674 Ohm
Lead Channel Setting Pacing Amplitude: 2.5 V
Lead Channel Setting Pacing Pulse Width: 0.4 ms
Lead Channel Setting Sensing Sensitivity: 0.5 mV
Pulse Gen Serial Number: 225390
Zone Setting Status: 755011

## 2022-01-25 NOTE — Progress Notes (Signed)
Remote ICD transmission.   

## 2022-02-20 ENCOUNTER — Ambulatory Visit
Admission: EM | Admit: 2022-02-20 | Discharge: 2022-02-20 | Disposition: A | Discharge status: Home / Self Care | Payer: Medicare HMO | Attending: Family Medicine | Admitting: Family Medicine

## 2022-02-20 DIAGNOSIS — F17200 Nicotine dependence, unspecified, uncomplicated: Secondary | ICD-10-CM | POA: Insufficient documentation

## 2022-02-20 DIAGNOSIS — R310 Gross hematuria: Secondary | ICD-10-CM | POA: Diagnosis not present

## 2022-02-20 LAB — CBC WITH DIFFERENTIAL/PLATELET
Abs Immature Granulocytes: 0.01 10*3/uL (ref 0.00–0.07)
Basophils Absolute: 0 10*3/uL (ref 0.0–0.1)
Basophils Relative: 1 %
Eosinophils Absolute: 0.2 10*3/uL (ref 0.0–0.5)
Eosinophils Relative: 5 %
HCT: 42.7 % (ref 36.0–46.0)
Hemoglobin: 13.6 g/dL (ref 12.0–15.0)
Immature Granulocytes: 0 %
Lymphocytes Relative: 57 %
Lymphs Abs: 1.8 10*3/uL (ref 0.7–4.0)
MCH: 29.6 pg (ref 26.0–34.0)
MCHC: 31.9 g/dL (ref 30.0–36.0)
MCV: 92.8 fL (ref 80.0–100.0)
Monocytes Absolute: 0.3 10*3/uL (ref 0.1–1.0)
Monocytes Relative: 8 %
Neutro Abs: 0.9 10*3/uL — ABNORMAL LOW (ref 1.7–7.7)
Neutrophils Relative %: 29 %
Platelets: 224 10*3/uL (ref 150–400)
RBC: 4.6 MIL/uL (ref 3.87–5.11)
RDW: 14.5 % (ref 11.5–15.5)
WBC: 3.3 10*3/uL — ABNORMAL LOW (ref 4.0–10.5)
nRBC: 0 % (ref 0.0–0.2)

## 2022-02-20 LAB — URINALYSIS, ROUTINE W REFLEX MICROSCOPIC
Glucose, UA: NEGATIVE mg/dL
Ketones, ur: NEGATIVE mg/dL
Nitrite: NEGATIVE
Protein, ur: 30 mg/dL — AB
Specific Gravity, Urine: 1.02 (ref 1.005–1.030)
pH: 6.5 (ref 5.0–8.0)

## 2022-02-20 LAB — URINALYSIS, MICROSCOPIC (REFLEX): RBC / HPF: >50 RBC/hpf (ref 0–5)

## 2022-02-20 LAB — BASIC METABOLIC PANEL
Anion gap: 11 (ref 5–15)
BUN: 17 mg/dL (ref 8–23)
CO2: 22 mmol/L (ref 22–32)
Calcium: 9.1 mg/dL (ref 8.9–10.3)
Chloride: 104 mmol/L (ref 98–111)
Creatinine, Ser: 1 mg/dL (ref 0.44–1.00)
GFR, Estimated: >60 mL/min (ref >60)
Glucose, Bld: 98 mg/dL (ref 70–99)
Potassium: 4.1 mmol/L (ref 3.5–5.1)
Sodium: 137 mmol/L (ref 135–145)

## 2022-02-20 MED ORDER — SULFAMETHOXAZOLE-TRIMETHOPRIM 800-160 MG PO TABS: 1.0000 | ORAL_TABLET | Freq: Two times a day (BID) | ORAL | 0 refills | Status: AC

## 2022-02-20 NOTE — Discharge Instructions (Addendum)
We will start antibiotics in the event that this is actually a urinary tract infection.  I sent your urine for culture and if your urine does not grow any bacteria you will be alerted to stop these antibiotics.   A referral was also placed for urology to discuss the blood in your urine.   Be sure to re-establish care at your primary care provider's office.

## 2022-02-20 NOTE — ED Triage Notes (Signed)
Pt c/o blood in urine x1day  Pt states that she always has back and leg pain and felt it could be "different" because of the blood in urine.  Pt states she has some loss of kidney function but it has been improving.

## 2022-02-20 NOTE — ED Provider Notes (Signed)
[facility]    CSN: [id number] Arrival date & time: 02/20/22  6160      History   Chief Complaint Chief Complaint  Patient presents with   Urinary Tract Infection    HPI Charlene Schmitt is a 67 y.o. female.   HPI   Charlene Schmitt presents for nausea and blood in urine. No new urinary frequency or urgency.  She takes Torsemide. Says her bladder has been giving her trouble since she was a little girl. No history of kidney stones. Has no abdominal pain or new back pain.  Has chronic leg and back pain that is mostly unchanged.  Feels full quicker in the last week.  No vomiting, diarrhea, unintended weight loss, fevers, night sweats.  She smokes about 1/2 ppd since she was 67 years old. She feels a bulge in her vagina at times and knows that she likely needs a hysterectomy.      Past Medical History:  Diagnosis Date   AICD (automatic cardioverter/defibrillator) present    Arthritis    "some; treated w/ibuprofen" (06/19/2016)   Chronic kidney disease    Chronic lower back pain    Chronic systolic CHF (congestive heart failure) (HCC)    a. 10/2011 Cath: nl cors, EF 15-20%;  b. 02/2012 Echo: EF 25-30%; c. 02/2016 Echo: EF 20-25%, diff HK, mild MR, mildly dil LA.   COPD (chronic obstructive pulmonary disease) (HCC)    History of recurrent miscarriages, not currently pregnant    Hypertension    Morbid obesity (HCC)    NICM (nonischemic cardiomyopathy) (HCC)    a. 10/2011 Cath: nl cors, EF 15-20%;  b. 02/2012 Echo: EF 25-30%; c. 02/2016 Echo: EF 20-25%; d. 05/2016 s/p BSX Vigilant EL single lead AICD.   PVC's (premature ventricular contractions)    a. 01/2016 Holter: 22% PVC's w/ multiple morphologies with runs.   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   Renal dysfunction 06/16/2013   Pain in soft tissues of limb 06/16/2013   Arthropathy 05/27/2013   Congestive heart failure (HCC) 05/27/2013   Essential hypertension 05/27/2013   Cough 12/15/2012   Tobacco use disorder 02/19/2012   Nonischemic cardiomyopathy (HCC) 12/03/2011   Shortness of breath 12/03/2011   Chronic airway obstruction (HCC) 12/02/2011   COPD (chronic obstructive pulmonary disease) (HCC) 12/02/2011    Past Surgical History:  Procedure Laterality Date   CARDIAC CATHETERIZATION  11/19/2011   GANGLION CYST EXCISION Right    ICD IMPLANT N/A 06/19/2016   Procedure: ICD Implant;  Surgeon: Duke Salvia, MD;  Location: [facility] INVASIVE CV LAB;  Service: Cardiovascular;  Laterality: N/A;   LACERATION REPAIR     finger; s/p stitches   TOOTH EXTRACTION     x2    OB History   No obstetric history on file.      Home Medications    Prior to Admission medications   Medication Sig Start Date End Date Taking? Authorizing Provider  amiodarone (PACERONE) 200 MG tablet TAKE 1 TABLET (200 MG) BY MOUTH ONCE EVERY OTHER DAY 12/11/21  Yes Gollan, Tollie Pizza, MD  aspirin 81 MG tablet Take 81 mg by mouth daily.   Yes [provider]  carvedilol (COREG) 6.25 MG tablet  TAKE 1 TABLET BY MOUTH 2 TIMES DAILY WITH A MEAL. TAKE 1 TABLET BY MOUTH 2 TIMES DAILY WITH A MEAL. 12/19/21  Yes Minna Merritts, MD  diphenhydrAMINE (BENADRYL) 25 mg capsule Take 25 mg by mouth daily.   Yes [provider]  Fluticasone-Salmeterol (ADVAIR) 250-50 MCG/DOSE AEPB Inhale 1 puff into the lungs as needed (for shortness of breath).    Yes [provider]  hydrALAZINE (APRESOLINE) 10 MG tablet TAKE 1 TABLET BY MOUTH THREE TIMES A DAY 12/19/21  Yes Gollan, Kathlene November, MD  ibuprofen (ADVIL,MOTRIN) 200 MG tablet Take 400 mg by mouth every 6 (six) hours as needed for headache or moderate pain.    Yes [provider]  isosorbide mononitrate (IMDUR) 30 MG 24 hr tablet Take 1 tablet (30 mg total) by mouth daily. 11/16/21  Yes Gollan, Kathlene November, MD  KLOR-CON M20 20 MEQ tablet TAKE 1 TABLET BY MOUTH TWICE A DAY 12/19/21  Yes Gollan, Kathlene November, MD  spironolactone (ALDACTONE) 25 MG tablet TAKE 1 TABLET (25 MG TOTAL) BY MOUTH DAILY. 12/19/21  Yes Gollan, Kathlene November, MD  sulfamethoxazole-trimethoprim (BACTRIM DS) 800-160 MG tablet Take 1 tablet by mouth 2 (two) times daily for 5 days. 02/20/22 02/25/22 Yes Shalise Rosado, DO  torsemide (DEMADEX) 20 MG tablet TAKE 1 TABLET BY MOUTH EVERY DAY 12/19/21  Yes Gollan, Kathlene November, MD    Family History Family History  Problem Relation Age of Onset   Hypertension Mother    Hypertension Father     Social History Social History   Tobacco Use   Smoking status: Every Day    Packs/day: 0.50    Years: 48.00    Total pack years: 24.00    Types: Cigarettes   Smokeless tobacco: Never  Vaping Use   Vaping Use: Never used  Substance Use Topics   Alcohol use: Yes    Comment: 06/19/2016 "nothing in years"   Drug use: No     Allergies   Ace inhibitors, Losartan, and Penicillins   Review of Systems Review of Systems: negative unless otherwise stated in HPI.      Physical Exam Triage Vital Signs ED Triage Vitals  Enc Vitals Group     BP 02/20/22 1116 (!) 142/91     Pulse Rate 02/20/22 1116 79     Resp 02/20/22 1116 (!) 24     Temp 02/20/22 1116 98.3 F (36.8 C)     Temp Source 02/20/22 1116 Oral     SpO2 02/20/22 1116 99 %     Weight 02/20/22 1114 230 lb (104.3 kg)     Height 02/20/22 1114 5\' 6"  (1.676 m)     Head Circumference --      Peak Flow --      Pain Score 02/20/22 1113 4     Pain Loc --      Pain Edu? --      Excl. in [place]? --    No data found.  Updated Vital Signs BP (!) 142/91 (BP Location: Left Arm)   Pulse 79   Temp 98.3 F (36.8 C) (Oral)   Resp (!) 24   Ht 5\' 6"  (1.676 m)   Wt 104.3 kg   SpO2 99%   BMI 37.12 kg/m   Visual  Acuity Right Eye Distance:   Left Eye Distance:   Bilateral Distance:    Right Eye Near:   Left Eye Near:    Bilateral Near:     Physical Exam GEN:  alert, pleasant elderly female and no distress    HENT:  mucus membranes moist, nares patent, no nasal discharge  EYES:   pupils equal and reactive, EOM intact NECK:  supple, normal ROM RESP:  clear to auscultation bilaterally, no increased work of breathing  CVS:   regular rate and rhythm, distal pulses intact   ABD:  soft, non-tender; bowel sounds present; no palpable masses,  no CVA tenderness  EXT:    Baseline ROM, atraumatic, no LE edema  NEURO:  baseline without focal findings,  speech normal, alert and oriented   Skin:   warm and dry Psych: Normal affect, appropriate speech and behavior      UC Treatments / Results  Labs (all labs ordered are listed, but only abnormal results are displayed) Labs Reviewed  URINALYSIS, ROUTINE W REFLEX MICROSCOPIC - Abnormal; Notable for the following components:      Result Value   Color, Urine AMBER (*)    APPearance CLOUDY (*)    Hgb urine dipstick LARGE (*)    Bilirubin Urine SMALL (*)    Protein, ur 30 (*)    Leukocytes,Ua SMALL (*)    All other components within normal limits  URINALYSIS, MICROSCOPIC (REFLEX) - Abnormal; Notable for the following components:   Bacteria, UA FEW (*)    All other components within normal limits  CBC WITH DIFFERENTIAL/PLATELET - Abnormal; Notable for the following components:   WBC 3.3 (*)    Neutro Abs 0.9 (*)    All other components within normal limits  URINE CULTURE  BASIC METABOLIC PANEL    EKG   Radiology No results found.  Procedures Procedures (including critical care time)  Medications Ordered in UC Medications - No data to display  Initial Impression / Assessment and Plan / UC Course  I have reviewed the triage vital signs and the nursing notes.  Pertinent labs & imaging results that were available during my care of the  patient were reviewed by me and considered in my medical decision making (see chart for details).       Patient is a 67 y.o. female  who smokes who presents for gross hematuria.  Overall patient is nontoxic-appearing and afebrile.  Vital signs stable.  She is afebrile.  Patient presents with painless gross hematuria for the past day.  She has no history of kidney stones.  She has frank blood in her urine that was confirmed on microscopy.  Urinalysis shows of possible evidence of acute cystitis.  CBC revealed mild leukopenia that is technically not new on as she was as low as 3.1 previously.  Serum creatinine is stable.  Will treat with Keflex as below.  Urine cultures sent follow-up sensitivities and DC antibiotics if needed.  On chart review, I do not see any evidence of renal imaging.  Unfortunately, we do not have that ability here today.  As patient does smoke I am worried that this may be underlying bladder cancer.  Etiology and prognosis uncertain.  Discussed urology referral and patient is agreeable.  Referral to urology placed.   ED and return precautions given and patient voiced understanding. Discussed MDM, treatment plan and plan for follow-up with patient  who agrees with plan.   Final Clinical Impressions(s) / UC Diagnoses   Final diagnoses:  Gross hematuria  Tobacco use disorder     Discharge Instructions      We will start antibiotics in the event that this is actually a urinary tract infection.  I sent your  urine for culture and if your urine does not grow any bacteria you will be alerted to stop these antibiotics.   A referral was also placed for urology to discuss the blood in your urine.   Be sure to re-establish care at your primary care provider's office.       ED Prescriptions     Medication Sig Dispense Auth. Provider   sulfamethoxazole-trimethoprim (BACTRIM DS) 800-160 MG tablet Take 1 tablet by mouth 2 (two) times daily for 5 days. 10 tablet Katha Cabal, DO      PDMP not reviewed this encounter.   Katha Cabal, DO 02/23/22 2230

## 2022-02-21 LAB — URINE CULTURE: Culture: NO GROWTH

## 2022-04-05 ENCOUNTER — Ambulatory Visit: Payer: Medicare HMO

## 2022-04-05 DIAGNOSIS — I428 Other cardiomyopathies: Secondary | ICD-10-CM

## 2022-04-05 LAB — CUP PACEART REMOTE DEVICE CHECK
Battery Remaining Longevity: 126 mo
Battery Remaining Percentage: 86 %
Brady Statistic RV Percent Paced: 0 %
Date Time Interrogation Session: 20240308010800
HighPow Impedance: 77 Ohm
Implantable Lead Connection Status: 753985
Implantable Lead Implant Date: 20180523
Implantable Lead Location: 753860
Implantable Lead Model: 293
Implantable Lead Serial Number: 430811
Implantable Pulse Generator Implant Date: 20180523
Lead Channel Impedance Value: 756 Ohm
Lead Channel Setting Pacing Amplitude: 2.5 V
Lead Channel Setting Pacing Pulse Width: 0.4 ms
Lead Channel Setting Sensing Sensitivity: 0.5 mV
Pulse Gen Serial Number: 225390
Zone Setting Status: 755011

## 2022-04-12 ENCOUNTER — Encounter: Payer: Self-pay | Admitting: Cardiology

## 2022-05-06 NOTE — Progress Notes (Signed)
Remote ICD transmission.   

## 2022-06-21 DIAGNOSIS — Z95810 Presence of automatic (implantable) cardiac defibrillator: Secondary | ICD-10-CM | POA: Insufficient documentation

## 2022-06-21 DIAGNOSIS — I4729 Other ventricular tachycardia: Secondary | ICD-10-CM | POA: Insufficient documentation

## 2022-06-25 ENCOUNTER — Other Ambulatory Visit
Admission: RE | Admit: 2022-06-25 | Discharge: 2022-06-25 | Disposition: A | Discharge status: Home / Self Care | Payer: Medicare HMO | Source: Ambulatory Visit | Attending: Internal Medicine | Admitting: Internal Medicine

## 2022-06-25 ENCOUNTER — Encounter: Payer: Self-pay | Admitting: Internal Medicine

## 2022-06-25 ENCOUNTER — Ambulatory Visit: Payer: Medicare HMO | Attending: Internal Medicine | Admitting: Internal Medicine

## 2022-06-25 VITALS — BP 130/88 | HR 77 | Ht 66.0 in | Wt 223.0 lb

## 2022-06-25 DIAGNOSIS — I4729 Other ventricular tachycardia: Secondary | ICD-10-CM

## 2022-06-25 DIAGNOSIS — I428 Other cardiomyopathies: Secondary | ICD-10-CM | POA: Diagnosis present

## 2022-06-25 DIAGNOSIS — I509 Heart failure, unspecified: Secondary | ICD-10-CM | POA: Diagnosis present

## 2022-06-25 DIAGNOSIS — Z95810 Presence of automatic (implantable) cardiac defibrillator: Secondary | ICD-10-CM | POA: Insufficient documentation

## 2022-06-25 DIAGNOSIS — Z79899 Other long term (current) drug therapy: Secondary | ICD-10-CM | POA: Insufficient documentation

## 2022-06-25 DIAGNOSIS — I493 Ventricular premature depolarization: Secondary | ICD-10-CM

## 2022-06-25 LAB — COMPREHENSIVE METABOLIC PANEL
ALT: 20 U/L (ref 0–44)
AST: 22 U/L (ref 15–41)
Albumin: 3.5 g/dL (ref 3.5–5.0)
Alkaline Phosphatase: 71 U/L (ref 38–126)
Anion gap: 7 (ref 5–15)
BUN: 19 mg/dL (ref 8–23)
CO2: 26 mmol/L (ref 22–32)
Calcium: 9.2 mg/dL (ref 8.9–10.3)
Chloride: 110 mmol/L (ref 98–111)
Creatinine, Ser: 1.07 mg/dL — ABNORMAL HIGH (ref 0.44–1.00)
GFR, Estimated: 57 mL/min — ABNORMAL LOW (ref >60)
Glucose, Bld: 90 mg/dL (ref 70–99)
Potassium: 4.2 mmol/L (ref 3.5–5.1)
Sodium: 143 mmol/L (ref 135–145)
Total Bilirubin: 0.7 mg/dL (ref 0.3–1.2)
Total Protein: 7 g/dL (ref 6.5–8.1)

## 2022-06-25 LAB — PACEMAKER DEVICE OBSERVATION

## 2022-06-25 LAB — TSH: TSH: 0.467 u[IU]/mL (ref 0.350–4.500)

## 2022-06-25 NOTE — Progress Notes (Signed)
Patient Care Team: Dione Housekeeper, MD as PCP - General [name], MD as PCP - Cardiology (Cardiology) Duke Salvia, MD as PCP - Electrophysiology (Cardiology)   HPI  Charlene Schmitt is a 67 y.o. female seen in followup for ICD-[employer] implanted 5/18 for primary prevention in  the setting of a persistent nonischemic cardiomyopathy and modest congestive heart failure and complex frequent ventricular ectopy now treated with amio.  It has been our impression that the myopathy preceded the ectopy.   She is on beta-blockers, RAAS blockers and hydralazine/nitrates.  Rash on losartan & has declined entresto in the past.    The patient denies chest pain, nocturnal dyspnea, orthopnea or peripheral edema.  There have been no palpitations, lightheadedness or syncope.  Complains of chronic shortness of breath.   Patient denies symptoms of respiratory, GI intolerance, sun sensitivity, neurological symptoms attributable to amiodarone.          Date % PVCs   1/18 22% multiple morphologies & runs  4/22 2.8%           DATE TEST  EF    10/13 CATH  15-20% NORMAL CAs  2/14 ECHO    25-30 %    2/18 ECHO  25%    4/18 Echo  25-30%     4/22  Echo  30-35%       Date TSH LFTs Hgb Cr K  5/ 18  0.659 13   1.34 4.3  9/21 0.614 16  13.5 1.34 4.7  4/22 0.797 20 13.4 1.37 4.8  12.22 0.561 15  1.21 4.9  6/23 0.586 19  1.45 4.4  11/23 0.60 20      1/24   13.6 1.0 4.1     Dyspnea on exertion. SGLT-2 ( 4/22) did not tolerate.     Current Outpatient Medications on File Prior to Visit  Medication Sig Dispense Refill   albuterol (VENTOLIN HFA) 108 (90 Base) MCG/ACT inhaler Inhale 2 puffs into the lungs every 4 (four) hours as needed for wheezing or shortness of breath.     amiodarone (PACERONE) 200 MG tablet TAKE 1 TABLET (200 MG) BY MOUTH ONCE EVERY OTHER DAY 45 tablet 1   aspirin 81 MG tablet Take 81 mg by mouth daily.     carvedilol (COREG) 6.25 MG tablet TAKE 1  TABLET BY MOUTH 2 TIMES DAILY WITH A MEAL. TAKE 1 TABLET BY MOUTH 2 TIMES DAILY WITH A MEAL. 180 tablet 3   diphenhydrAMINE (BENADRYL) 25 mg capsule Take 25 mg by mouth daily.     hydrALAZINE (APRESOLINE) 10 MG tablet TAKE 1 TABLET BY MOUTH THREE TIMES A DAY 90 tablet 11   ibuprofen (ADVIL,MOTRIN) 200 MG tablet Take 400 mg by mouth every 6 (six) hours as needed for headache or moderate pain.      isosorbide mononitrate (IMDUR) 30 MG 24 hr tablet Take 1 tablet (30 mg total) by mouth daily. 90 tablet 3   KLOR-CON M20 20 MEQ tablet TAKE 1 TABLET BY MOUTH TWICE A DAY 60 tablet 11   rosuvastatin (CRESTOR) 20 MG tablet Take 20 mg by mouth daily.     spironolactone (ALDACTONE) 25 MG tablet TAKE 1 TABLET (25 MG TOTAL) BY MOUTH DAILY. 30 tablet 11   torsemide (DEMADEX) 20 MG tablet TAKE 1 TABLET BY MOUTH EVERY DAY 90 tablet 3   No current facility-administered medications on file prior to visit.     Physical Exam BP 130/88  Pulse 77   Ht 5\' 6"  (1.676 m)   Wt 223 lb (101.2 kg)   SpO2 97%   BMI 35.99 kg/m  Well developed and well nourished in no acute distress HENT normal Neck supple with JVP-flat Clear Device pocket well healed; without hematoma or erythema.  There is no tethering  Regular rate and rhythm, no  gallop No  murmur Abd-soft with active BS No Clubbing cyanosis  edema Skin-warm and dry A & Oriented  Grossly normal sensory and motor function  ECG sinus at 86 Intervals 18/10/37 Nonspecific T wave flattening  Device function is normal. 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  Continue Demadex and  spironolactone.  With her cardiomyopathy, continue hydralazine and nitrates as well as carvedilol.  Ventricular ectopy largely suppressed.  Continue the amiodarone.  Will check surveillance laboratories

## 2022-06-25 NOTE — Patient Instructions (Signed)
Medication Instructions:  Your physician recommends that you continue on your current medications as directed. Please refer to the Current Medication list given to you today.  *If you need a refill on your cardiac medications before your next appointment, please call your pharmacy*   Lab Work: CMET and TSH  If you have labs (blood work) drawn today and your tests are completely normal, you will receive your results only by: MyChart Message (if you have MyChart) OR A paper copy in the mail If you have any lab test that is abnormal or we need to change your treatment, we will call you to review the results.   Testing/Procedures: None ordered.    Follow-Up: At [facility], you and your health needs are our priority.  As part of our continuing mission to provide you with exceptional heart care, we have created designated Provider Care Teams.  These Care Teams include your primary Cardiologist (physician) and Advanced Practice Providers (APPs -  Physician Assistants and Nurse Practitioners) who all work together to provide you with the care you need, when you need it.  We recommend signing up for the patient portal called "MyChart".  Sign up information is provided on this After Visit Summary.  MyChart is used to connect with patients for Virtual Visits (Telemedicine).  Patients are able to view lab/test results, encounter notes, upcoming appointments, etc.  Non-urgent messages can be sent to your provider as well.   To learn more about what you can do with MyChart, go to [email or web address].    Your next appointment:   6 months with Dr Graciela Husbands

## 2022-07-05 ENCOUNTER — Ambulatory Visit (INDEPENDENT_AMBULATORY_CARE_PROVIDER_SITE_OTHER): Payer: Medicare HMO

## 2022-07-05 DIAGNOSIS — I428 Other cardiomyopathies: Secondary | ICD-10-CM

## 2022-07-08 LAB — CUP PACEART REMOTE DEVICE CHECK
Battery Remaining Longevity: 126 mo
Battery Remaining Percentage: 86 %
Brady Statistic RV Percent Paced: 0 %
Date Time Interrogation Session: 20240607004100
HighPow Impedance: 82 Ohm
Implantable Lead Connection Status: 753985
Implantable Lead Implant Date: 20180523
Implantable Lead Location: 753860
Implantable Lead Model: 293
Implantable Lead Serial Number: 430811
Implantable Pulse Generator Implant Date: 20180523
Lead Channel Impedance Value: 690 Ohm
Lead Channel Setting Pacing Amplitude: 2.5 V
Lead Channel Setting Pacing Pulse Width: 0.4 ms
Lead Channel Setting Sensing Sensitivity: 0.5 mV
Pulse Gen Serial Number: 225390
Zone Setting Status: 755011

## 2022-07-23 NOTE — Progress Notes (Signed)
Remote ICD transmission.   

## 2022-08-20 ENCOUNTER — Encounter: Payer: Self-pay | Admitting: Internal Medicine

## 2022-10-04 ENCOUNTER — Ambulatory Visit (INDEPENDENT_AMBULATORY_CARE_PROVIDER_SITE_OTHER): Payer: Medicare HMO

## 2022-10-04 DIAGNOSIS — I428 Other cardiomyopathies: Secondary | ICD-10-CM

## 2022-10-04 LAB — CUP PACEART REMOTE DEVICE CHECK
Battery Remaining Longevity: 132 mo
Battery Remaining Percentage: 93 %
Brady Statistic RV Percent Paced: 0 %
Date Time Interrogation Session: 20240906004200
HighPow Impedance: 76 Ohm
Implantable Lead Connection Status: 753985
Implantable Lead Implant Date: 20180523
Implantable Lead Location: 753860
Implantable Lead Model: 293
Implantable Lead Serial Number: 430811
Implantable Pulse Generator Implant Date: 20180523
Lead Channel Impedance Value: 620 Ohm
Lead Channel Setting Pacing Amplitude: 2.5 V
Lead Channel Setting Pacing Pulse Width: 0.4 ms
Lead Channel Setting Sensing Sensitivity: 0.5 mV
Pulse Gen Serial Number: 225390
Zone Setting Status: 755011

## 2022-10-08 NOTE — Progress Notes (Signed)
Remote ICD transmission.   

## 2022-12-17 ENCOUNTER — Other Ambulatory Visit: Payer: Self-pay | Admitting: Cardiovascular Disease

## 2022-12-17 ENCOUNTER — Telehealth: Payer: Self-pay | Admitting: Cardiovascular Disease

## 2022-12-17 DIAGNOSIS — I428 Other cardiomyopathies: Secondary | ICD-10-CM

## 2022-12-17 DIAGNOSIS — I5022 Chronic systolic (congestive) heart failure: Secondary | ICD-10-CM

## 2022-12-17 NOTE — Telephone Encounter (Signed)
Unable to leave voicemail, pt needs appt scheduled from recall

## 2022-12-17 NOTE — Telephone Encounter (Signed)
Unable to leave voicemail, mailbox not set up.

## 2022-12-17 NOTE — Telephone Encounter (Signed)
Please contact pt for future appointment. Pt due for 12 month f/u with Gollan.

## 2022-12-19 NOTE — Telephone Encounter (Signed)
Pt scheduled in Jan with Mariah Milling

## 2022-12-31 ENCOUNTER — Encounter: Payer: Medicare HMO | Admitting: Internal Medicine

## 2023-01-03 ENCOUNTER — Ambulatory Visit (INDEPENDENT_AMBULATORY_CARE_PROVIDER_SITE_OTHER): Payer: Medicare HMO

## 2023-01-03 DIAGNOSIS — I428 Other cardiomyopathies: Secondary | ICD-10-CM

## 2023-01-03 LAB — CUP PACEART REMOTE DEVICE CHECK
Battery Remaining Longevity: 126 mo
Battery Remaining Percentage: 87 %
Brady Statistic RV Percent Paced: 0 %
Date Time Interrogation Session: 20241206004200
HighPow Impedance: 85 Ohm
Implantable Lead Connection Status: 753985
Implantable Lead Implant Date: 20180523
Implantable Lead Location: 753860
Implantable Lead Model: 293
Implantable Lead Serial Number: 430811
Implantable Pulse Generator Implant Date: 20180523
Lead Channel Impedance Value: 664 Ohm
Lead Channel Setting Pacing Amplitude: 2.5 V
Lead Channel Setting Pacing Pulse Width: 0.4 ms
Lead Channel Setting Sensing Sensitivity: 0.5 mV
Pulse Gen Serial Number: 225390
Zone Setting Status: 755011

## 2023-01-07 ENCOUNTER — Ambulatory Visit: Payer: Medicare HMO | Attending: Internal Medicine | Admitting: Internal Medicine

## 2023-01-07 ENCOUNTER — Encounter: Payer: Self-pay | Admitting: Internal Medicine

## 2023-01-07 VITALS — BP 104/70 | HR 72 | Ht 66.0 in | Wt 213.2 lb

## 2023-01-07 DIAGNOSIS — I4729 Other ventricular tachycardia: Secondary | ICD-10-CM | POA: Diagnosis not present

## 2023-01-07 DIAGNOSIS — I428 Other cardiomyopathies: Secondary | ICD-10-CM | POA: Diagnosis not present

## 2023-01-07 DIAGNOSIS — Z95810 Presence of automatic (implantable) cardiac defibrillator: Secondary | ICD-10-CM | POA: Diagnosis not present

## 2023-01-07 DIAGNOSIS — Z79899 Other long term (current) drug therapy: Secondary | ICD-10-CM

## 2023-01-07 DIAGNOSIS — I509 Heart failure, unspecified: Secondary | ICD-10-CM

## 2023-01-07 LAB — PACEMAKER DEVICE OBSERVATION

## 2023-01-07 NOTE — Progress Notes (Signed)
Patient Care Team: Dione Housekeeper, MD as PCP - General [name], MD as PCP - Cardiology (Cardiology) Duke Salvia, MD as PCP - Electrophysiology (Cardiology)   HPI  Charlene Schmitt is a 67 y.o. female seen in followup for ICD-[employer] implanted 5/18 for primary prevention in  the setting of a persistent nonischemic cardiomyopathy and modest congestive heart failure and complex frequent ventricular ectopy now treated with amio.  It has been our impression that the myopathy preceded the ectopy.   She is on beta-blockers, RAAS blockers and hydralazine/nitrates.  Rash on losartan & has declined entresto in the past.    The patient denies chest pain, worsening shortness of breath, nocturnal dyspnea, orthopnea or peripheral edema.  There have been no palpitations, lightheadedness or syncope. .  Patient denies symptoms of respiratory, GI intolerance, sun sensitivity, neurological symptoms attributable to amiodarone.  It is now right at    Date % PVCs   1/18 22% multiple morphologies & runs  4/22 2.8%           DATE TEST  EF    10/13 CATH  15-20% NORMAL CAs  2/14 ECHO    25-30 %    2/18 ECHO  25%    4/18 Echo  25-30%     4/22  Echo  30-35%       Date TSH LFTs Hgb Cr K  5/ 18  0.659 13   1.34 4.3  9/21 0.614 16  13.5 1.34 4.7  4/22 0.797 20 13.4 1.37 4.8  12.22 0.561 15  1.21 4.9  6/23 0.586 19  1.45 4.4  11/23 0.60 20      5/24 0.46 20 13.6 1.07 4.2            Dyspnea on exertion. SGLT-2 ( 4/22) did not tolerate.     Current Outpatient Medications on File Prior to Visit  Medication Sig Dispense Refill   albuterol (VENTOLIN HFA) 108 (90 Base) MCG/ACT inhaler Inhale 2 puffs into the lungs every 4 (four) hours as needed for wheezing or shortness of breath.     amiodarone (PACERONE) 200 MG tablet TAKE 1 TABLET (200 MG) BY MOUTH ONCE EVERY OTHER DAY 45 tablet 1   aspirin 81 MG tablet Take 81 mg by mouth daily.     carvedilol (COREG) 6.25 MG  tablet TAKE 1 TABLET BY MOUTH 2 TIMES DAILY WITH A MEAL. TAKE 1 TABLET BY MOUTH 2 TIMES DAILY WITH A MEAL. 60 tablet 0   diphenhydrAMINE (BENADRYL) 25 mg capsule Take 25 mg by mouth daily.     hydrALAZINE (APRESOLINE) 10 MG tablet TAKE 1 TABLET BY MOUTH THREE TIMES A DAY 30 tablet 0   ibuprofen (ADVIL,MOTRIN) 200 MG tablet Take 400 mg by mouth every 6 (six) hours as needed for headache or moderate pain.      isosorbide mononitrate (IMDUR) 30 MG 24 hr tablet TAKE 1 TABLET BY MOUTH EVERY DAY 30 tablet 0   KLOR-CON M20 20 MEQ tablet TAKE 1 TABLET BY MOUTH TWICE A DAY 60 tablet 11   rosuvastatin (CRESTOR) 20 MG tablet Take 20 mg by mouth daily.     spironolactone (ALDACTONE) 25 MG tablet TAKE 1 TABLET (25 MG TOTAL) BY MOUTH DAILY. 30 tablet 0   torsemide (DEMADEX) 20 MG tablet TAKE 1 TABLET BY MOUTH EVERY DAY 30 tablet 0   No current facility-administered medications on file prior to visit.     Physical Exam BP 104/70 (  BP Location: Left Arm, Patient Position: Sitting, Cuff Size: Large)   Pulse 72   Ht 5\' 6"  (1.676 m)   Wt 213 lb 4 oz (96.7 kg)   SpO2 97%   BMI 34.42 kg/m  Well developed and well nourished in no acute distress odor of cigarettes HENT normal Neck supple with JVP-flat Clear Device pocket well healed; without hematoma or erythema.  There is no tethering  Regular rate and rhythm, no  gallop No  murmur Abd-soft with active BS No Clubbing cyanosis  edema Skin-warm and dry A & Oriented  Grossly normal sensory and motor function  ECG sinus @ 72 18/11/38  Device function is normal. 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

## 2023-01-07 NOTE — Patient Instructions (Signed)
Medication Instructions:  Your physician recommends that you continue on your current medications as directed. Please refer to the Current Medication list given to you today.  *If you need a refill on your cardiac medications before your next appointment, please call your pharmacy*   Lab Work: CMET and TSH today If you have labs (blood work) drawn today and your tests are completely normal, you will receive your results only by: MyChart Message (if you have MyChart) OR A paper copy in the mail If you have any lab test that is abnormal or we need to change your treatment, we will call you to review the results.   Testing/Procedures: None ordered.    Follow-Up: At [facility], you and your health needs are our priority.  As part of our continuing mission to provide you with exceptional heart care, we have created designated Provider Care Teams.  These Care Teams include your primary Cardiologist (physician) and Advanced Practice Providers (APPs -  Physician Assistants and Nurse Practitioners) who all work together to provide you with the care you need, when you need it.  We recommend signing up for the patient portal called "MyChart".  Sign up information is provided on this After Visit Summary.  MyChart is used to connect with patients for Virtual Visits (Telemedicine).  Patients are able to view lab/test results, encounter notes, upcoming appointments, etc.  Non-urgent messages can be sent to your provider as well.   To learn more about what you can do with MyChart, go to [email or web address].    Your next appointment:   6 months with Dr Graciela Husbands

## 2023-01-08 LAB — COMPREHENSIVE METABOLIC PANEL
ALT: 15 [IU]/L (ref 0–32)
AST: 18 [IU]/L (ref 0–40)
Albumin: 4.1 g/dL (ref 3.9–4.9)
Alkaline Phosphatase: 98 [IU]/L (ref 44–121)
BUN/Creatinine Ratio: 18 (ref 12–28)
BUN: 21 mg/dL (ref 8–27)
Bilirubin Total: 0.6 mg/dL (ref 0.0–1.2)
CO2: 23 mmol/L (ref 20–29)
Calcium: 10.4 mg/dL — ABNORMAL HIGH (ref 8.7–10.3)
Chloride: 106 mmol/L (ref 96–106)
Creatinine, Ser: 1.15 mg/dL — ABNORMAL HIGH (ref 0.57–1.00)
Globulin, Total: 2.7 g/dL (ref 1.5–4.5)
Glucose: 108 mg/dL — ABNORMAL HIGH (ref 70–99)
Potassium: 4.9 mmol/L (ref 3.5–5.2)
Sodium: 144 mmol/L (ref 134–144)
Total Protein: 6.8 g/dL (ref 6.0–8.5)
eGFR: 52 mL/min/{1.73_m2} — ABNORMAL LOW (ref >59)

## 2023-01-08 LAB — TSH: TSH: 0.78 u[IU]/mL (ref 0.450–4.500)

## 2023-01-13 ENCOUNTER — Encounter: Payer: Self-pay | Admitting: Internal Medicine

## 2023-01-13 ENCOUNTER — Other Ambulatory Visit: Payer: Self-pay | Admitting: Cardiovascular Disease

## 2023-01-13 DIAGNOSIS — I5022 Chronic systolic (congestive) heart failure: Secondary | ICD-10-CM

## 2023-01-13 DIAGNOSIS — I428 Other cardiomyopathies: Secondary | ICD-10-CM

## 2023-02-24 NOTE — Progress Notes (Unsigned)
Date:  02/25/2023   ID:  Charlene Schmitt, DOB 19-Jul-1955, MRN [id number]  Patient Location:  [address] [place] [address]   Provider location:   [facility], [place] office  PCP:  Dione Housekeeper, MD  Cardiologist:  [facility]   Chief Complaint  Patient presents with   12 month follow up     "Doing well."     History of Present Illness:    Charlene Schmitt is a 68 y.o. female  past medical history of obesity,  hypertension,  nonischemic cardiomyopathy,ICD placed in 05/2016 ejection fraction less than 25%, In 2018 cardiac catheterization showing no significant coronary artery disease, normal right ventricular systolic pressures on echocardiogram.  Smoker  PVCs, on amiodarone Ejection fraction 30 up to 35% in April 2022 She presents for routine follow up of her cardiomyopathy.  Last seen in clinic by myself 10/23 Seen by EP 12/2022  Helping sister in [place], [place]  Active, no regular exercise program Large extended family Continues to smoke, unsuccessful in attempts to quit smoking  Placed with 2 grandchildren  Denies significant leg swelling, no PND orthopnea Weight down 10 pounds since May 2024 through dietary changes  Labs reviewed: A1C 5.9 Stable renal function  On amiodarone 200 every other day, torsemide 20 daily, sometime half dose, sometimes misses a dose  ICD download reviewed, no significant arrhythmia  Chronic hip and knee pain, prior cortisone shots  medication intolerances Rash on losartan, cough on ACE inhibitor Not on Entresto presumably secondary to ARB rash Stomach upset on jardiance, had to stop  EKG personally reviewed by myself on todays visit EKG Interpretation Date/Time:  Tuesday February 25 2023 10:10:40 EST Ventricular Rate:  82 PR Interval:  190 QRS Duration:  106 QT Interval:  394 QTC Calculation: 460 R Axis:   -21  Text Interpretation: Normal sinus rhythm Moderate  voltage criteria for LVH, may be normal variant ( R in aVL , Cornell product ) ST & T wave abnormality, consider lateral ischemia When compared with ECG of 07-Jan-2023 09:16, No significant change was found Confirmed by Julien Nordmann [phone]) on 02/25/2023 10:31:11 AM    Other past medical history   x-rays showing L2-L5 significant disc disease Previous renal dysfunction in the setting of NSAIDs and diuresis renal ultrasound 05/29/2011 showing no hydronephrosis  seen by renal service at [facility]     Past Medical History:  Diagnosis Date   AICD (automatic cardioverter/defibrillator) present    Arthritis    "some; treated w/ibuprofen" (06/19/2016)   Chronic kidney disease    Chronic lower back pain    Chronic systolic CHF (congestive heart failure) (HCC)    a. 10/2011 Cath: nl cors, EF 15-20%;  b. 02/2012 Echo: EF 25-30%; c. 02/2016 Echo: EF 20-25%, diff HK, mild MR, mildly dil LA.   COPD (chronic obstructive pulmonary disease) (HCC)    History of recurrent miscarriages, not currently pregnant    Hypertension    Morbid obesity (HCC)    NICM (nonischemic cardiomyopathy) (HCC)    a. 10/2011 Cath: nl cors, EF 15-20%;  b. 02/2012 Echo: EF 25-30%; c. 02/2016 Echo: EF 20-25%; d. 05/2016 s/p BSX Vigilant EL single lead AICD.   PVC's (premature ventricular contractions)    a. 01/2016 Holter: 22% PVC's w/ multiple morphologies with runs.   Tobacco abuse    Past Surgical History:  Procedure Laterality Date   CARDIAC CATHETERIZATION  11/19/2011   GANGLION CYST EXCISION Right  ICD IMPLANT N/A 06/19/2016   Procedure: ICD Implant;  Surgeon: Duke Salvia, MD;  Location: [facility] INVASIVE CV LAB;  Service: Cardiovascular;  Laterality: N/A;   LACERATION REPAIR     finger; s/p stitches   TOOTH EXTRACTION     x2    Current Outpatient Medications on File Prior to Visit  Medication Sig Dispense Refill   albuterol (VENTOLIN HFA) 108 (90 Base) MCG/ACT inhaler Inhale 2 puffs into the lungs every 4 (four) hours as  needed for wheezing or shortness of breath.     amiodarone (PACERONE) 200 MG tablet TAKE 1 TABLET (200 MG) BY MOUTH ONCE EVERY OTHER DAY 45 tablet 1   carvedilol (COREG) 6.25 MG tablet TAKE 1 TABLET BY MOUTH TWICE A DAY WITH MEALS 180 tablet 0   diphenhydrAMINE (BENADRYL) 25 mg capsule Take 25 mg by mouth daily.     hydrALAZINE (APRESOLINE) 10 MG tablet TAKE 1 TABLET BY MOUTH THREE TIMES A DAY 30 tablet 0   ibuprofen (ADVIL,MOTRIN) 200 MG tablet Take 400 mg by mouth every 6 (six) hours as needed for headache or moderate pain.      isosorbide mononitrate (IMDUR) 30 MG 24 hr tablet TAKE 1 TABLET BY MOUTH EVERY DAY 90 tablet 0   KLOR-CON M20 20 MEQ tablet TAKE 1 TABLET BY MOUTH TWICE A DAY 60 tablet 11   rosuvastatin (CRESTOR) 20 MG tablet Take 20 mg by mouth daily.     spironolactone (ALDACTONE) 25 MG tablet TAKE 1 TABLET (25 MG TOTAL) BY MOUTH DAILY. 90 tablet 0   torsemide (DEMADEX) 20 MG tablet TAKE 1 TABLET BY MOUTH EVERY DAY 90 tablet 0   aspirin 81 MG tablet Take 81 mg by mouth daily. (Patient not taking: Reported on 02/25/2023)     No current facility-administered medications on file prior to visit.    Allergies:   Amoxicillin-pot clavulanate, Ace inhibitors, Losartan, and Penicillins   Social History   Tobacco Use   Smoking status: Every Day    Current packs/day: 0.50    Average packs/day: 0.5 packs/day for 48.0 years (24.0 ttl pk-yrs)    Types: Cigarettes   Smokeless tobacco: Never  Vaping Use   Vaping status: Never Used  Substance Use Topics   Alcohol use: Yes    Comment: 06/19/2016 "nothing in years"   Drug use: No     Family Hx: The patient's family history includes Hypertension in her father and mother.  ROS:   Please see the history of present illness.    Review of Systems  Constitutional: Negative.   Respiratory: Negative.    Cardiovascular: Negative.   Gastrointestinal: Negative.   Musculoskeletal: Negative.   Neurological: Negative.   Psychiatric/Behavioral:  Negative.    All other systems reviewed and are negative.    Labs/Other Tests and Data Reviewed:    Recent Labs: 01/07/2023: ALT 15; BUN 21; Creatinine, Ser 1.15; Potassium 4.9; Sodium 144; TSH 0.780   Recent Lipid Panel Lab Results  Component Value Date/Time   CHOL 172 11/18/2011 04:55 AM   TRIG 89 11/18/2011 04:55 AM   HDL 74 (H) 11/18/2011 04:55 AM   LDLCALC 80 11/18/2011 04:55 AM    Wt Readings from Last 3 Encounters:  02/25/23 212 lb 6 oz (96.3 kg)  01/07/23 213 lb 4 oz (96.7 kg)  06/25/22 223 lb (101.2 kg)     Exam:    BP 124/86 (BP Location: Left Arm, Patient Position: Sitting, Cuff Size: Large)   Pulse 82   Ht  5' 5.5" (1.664 m)   Wt 212 lb 6 oz (96.3 kg)   SpO2 98%   BMI 34.80 kg/m  Constitutional:  oriented to person, place, and time. No distress.  HENT:  Head: Grossly normal Eyes:  no discharge. No scleral icterus.  Neck: No JVD, no carotid bruits  Cardiovascular: Regular rate and rhythm, no murmurs appreciated Pulmonary/Chest: Clear to auscultation bilaterally, no wheezes or rails Abdominal: Soft.  no distension.  no tenderness.  Musculoskeletal: Normal range of motion Neurological:  normal muscle tone. Coordination normal. No atrophy Skin: Skin warm and dry Psychiatric: normal affect, pleasant   ASSESSMENT & PLAN:    1. Nonischemic cardiomyopathy (HCC) Tolerating current medications,  Previously declined medication changes did not tolerate Jardiance Rash on ARB, cough on ACE Does not want Entresto,  Echocardiogram ejection fraction 35% Weight down 10 pounds from May 2024, appears euvolemic No changes to medications made  2. NSVT (nonsustained ventricular tachycardia) (HCC) Pacer downloads reviewed, no sick evidence arrhythmia Followed by Dr. Graciela Husbands, on amiodarone  3. Chronic systolic heart failure (HCC) Euvolemic, stable renal function Periodically missing her torsemide Blood pressure stable  4. ICD (implantable  cardioverter-defibrillator) in place Device placed May 2018 Followed by Dr. Graciela Husbands  5. Frequent PVCs Asymptomatic, low PVC burden in 2022, asymptomatic On amiodarone  6. Claudication [facility]) chronic leg pain, ABIs normal Smoking cessation recommended  7. Essential hypertension Blood pressure is well controlled on today's visit. No changes made to the medications.   Signed, Julien Nordmann, MD  02/25/2023 10:31 AM    [facility] [address], [place], [place] [address]

## 2023-02-25 ENCOUNTER — Encounter: Payer: Self-pay | Admitting: Cardiovascular Disease

## 2023-02-25 ENCOUNTER — Ambulatory Visit: Payer: Medicare HMO | Attending: Cardiovascular Disease | Admitting: Cardiovascular Disease

## 2023-02-25 VITALS — BP 124/86 | HR 82 | Ht 65.5 in | Wt 212.4 lb

## 2023-02-25 DIAGNOSIS — I428 Other cardiomyopathies: Secondary | ICD-10-CM | POA: Diagnosis not present

## 2023-02-25 DIAGNOSIS — I5022 Chronic systolic (congestive) heart failure: Secondary | ICD-10-CM

## 2023-02-25 DIAGNOSIS — Z79899 Other long term (current) drug therapy: Secondary | ICD-10-CM

## 2023-02-25 DIAGNOSIS — I509 Heart failure, unspecified: Secondary | ICD-10-CM | POA: Diagnosis not present

## 2023-02-25 DIAGNOSIS — I4729 Other ventricular tachycardia: Secondary | ICD-10-CM

## 2023-02-25 DIAGNOSIS — I10 Essential (primary) hypertension: Secondary | ICD-10-CM

## 2023-02-25 DIAGNOSIS — Z95810 Presence of automatic (implantable) cardiac defibrillator: Secondary | ICD-10-CM

## 2023-02-25 DIAGNOSIS — I493 Ventricular premature depolarization: Secondary | ICD-10-CM

## 2023-02-25 MED ORDER — POTASSIUM CHLORIDE CRYS ER 20 MEQ PO TBCR: 20.0000 meq | EXTENDED_RELEASE_TABLET | Freq: Two times a day (BID) | ORAL | 3 refills | Status: AC

## 2023-02-25 MED ORDER — ISOSORBIDE MONONITRATE ER 30 MG PO TB24: 30.0000 mg | ORAL_TABLET | Freq: Every day | ORAL | 3 refills | Status: AC

## 2023-02-25 MED ORDER — AMIODARONE HCL 200 MG PO TABS: ORAL_TABLET | ORAL | 3 refills | Status: AC

## 2023-02-25 MED ORDER — TORSEMIDE 20 MG PO TABS: 20.0000 mg | ORAL_TABLET | Freq: Every day | ORAL | 3 refills | Status: AC

## 2023-02-25 MED ORDER — SPIRONOLACTONE 25 MG PO TABS: 25.0000 mg | ORAL_TABLET | Freq: Every day | ORAL | 3 refills | Status: AC

## 2023-02-25 MED ORDER — CARVEDILOL 6.25 MG PO TABS: 6.2500 mg | ORAL_TABLET | Freq: Two times a day (BID) | ORAL | 3 refills | Status: AC

## 2023-02-25 MED ORDER — HYDRALAZINE HCL 10 MG PO TABS: 10.0000 mg | ORAL_TABLET | Freq: Three times a day (TID) | ORAL | 3 refills | Status: AC

## 2023-02-25 NOTE — Patient Instructions (Signed)
Medication Instructions:  No changes  If you need a refill on your cardiac medications before your next appointment, please call your pharmacy.   Lab work: No new labs needed  Testing/Procedures: No new testing needed  Follow-Up: At [facility], you and your health needs are our priority.  As part of our continuing mission to provide you with exceptional heart care, we have created designated Provider Care Teams.  These Care Teams include your primary Cardiologist (physician) and Advanced Practice Providers (APPs -  Physician Assistants and Nurse Practitioners) who all work together to provide you with the care you need, when you need it.  You will need a follow up appointment in 12 months  Providers on your designated Care Team:   Nicolasa Ducking, NP Eula Listen, PA-C Cadence Fransico Michael, [place]  COVID-19 Vaccine Information can be found at: [email or web address] For questions related to vaccine distribution or appointments, please email [email or web address] or call [phone].

## 2023-04-04 ENCOUNTER — Ambulatory Visit (INDEPENDENT_AMBULATORY_CARE_PROVIDER_SITE_OTHER): Payer: Medicare HMO

## 2023-04-04 DIAGNOSIS — I428 Other cardiomyopathies: Secondary | ICD-10-CM | POA: Diagnosis not present

## 2023-04-06 LAB — CUP PACEART REMOTE DEVICE CHECK
Battery Remaining Longevity: 120 mo
Battery Remaining Percentage: 83 %
Brady Statistic RV Percent Paced: 0 %
Date Time Interrogation Session: 20250307004100
HighPow Impedance: 69 Ohm
Implantable Lead Connection Status: 753985
Implantable Lead Implant Date: 20180523
Implantable Lead Location: 753860
Implantable Lead Model: 293
Implantable Lead Serial Number: 430811
Implantable Pulse Generator Implant Date: 20180523
Lead Channel Impedance Value: 532 Ohm
Lead Channel Setting Pacing Amplitude: 3 V
Lead Channel Setting Pacing Pulse Width: 0.4 ms
Lead Channel Setting Sensing Sensitivity: 0.5 mV
Pulse Gen Serial Number: 225390
Zone Setting Status: 755011

## 2023-05-08 NOTE — Progress Notes (Signed)
 Remote ICD transmission.

## 2023-05-08 NOTE — Addendum Note (Signed)
 Addended by: Elease Etienne A on: 05/08/2023 10:28 AM   Modules accepted: Orders

## 2023-07-04 ENCOUNTER — Ambulatory Visit (INDEPENDENT_AMBULATORY_CARE_PROVIDER_SITE_OTHER): Payer: Medicare HMO

## 2023-07-04 DIAGNOSIS — I428 Other cardiomyopathies: Secondary | ICD-10-CM

## 2023-07-04 LAB — CUP PACEART REMOTE DEVICE CHECK
Battery Remaining Longevity: 120 mo
Battery Remaining Percentage: 83 %
Brady Statistic RV Percent Paced: 0 %
Date Time Interrogation Session: 20250606004100
HighPow Impedance: 76 Ohm
Implantable Lead Connection Status: 753985
Implantable Lead Implant Date: 20180523
Implantable Lead Location: 753860
Implantable Lead Model: 293
Implantable Lead Serial Number: 430811
Implantable Pulse Generator Implant Date: 20180523
Lead Channel Impedance Value: 611 Ohm
Lead Channel Setting Pacing Amplitude: 3 V
Lead Channel Setting Pacing Pulse Width: 0.4 ms
Lead Channel Setting Sensing Sensitivity: 0.5 mV
Pulse Gen Serial Number: 225390
Zone Setting Status: 755011

## 2023-07-07 ENCOUNTER — Ambulatory Visit: Payer: Self-pay | Admitting: Cardiology

## 2023-08-19 NOTE — Progress Notes (Signed)
 Remote ICD transmission.

## 2023-10-02 ENCOUNTER — Other Ambulatory Visit: Payer: Self-pay | Admitting: Family Medicine

## 2023-10-02 DIAGNOSIS — I10 Essential (primary) hypertension: Secondary | ICD-10-CM

## 2023-10-03 ENCOUNTER — Ambulatory Visit (INDEPENDENT_AMBULATORY_CARE_PROVIDER_SITE_OTHER): Payer: Medicare HMO

## 2023-10-03 ENCOUNTER — Other Ambulatory Visit: Payer: Self-pay | Admitting: Family Medicine

## 2023-10-03 DIAGNOSIS — Z87891 Personal history of nicotine dependence: Secondary | ICD-10-CM

## 2023-10-03 DIAGNOSIS — I428 Other cardiomyopathies: Secondary | ICD-10-CM

## 2023-10-03 DIAGNOSIS — F17200 Nicotine dependence, unspecified, uncomplicated: Secondary | ICD-10-CM

## 2023-10-07 ENCOUNTER — Ambulatory Visit
Admission: RE | Admit: 2023-10-07 | Discharge: 2023-10-07 | Disposition: A | Discharge status: Home / Self Care | Source: Ambulatory Visit | Attending: Family Medicine | Admitting: Family Medicine

## 2023-10-07 DIAGNOSIS — F17200 Nicotine dependence, unspecified, uncomplicated: Secondary | ICD-10-CM | POA: Diagnosis present

## 2023-10-07 DIAGNOSIS — Z87891 Personal history of nicotine dependence: Secondary | ICD-10-CM | POA: Diagnosis present

## 2023-10-08 LAB — CUP PACEART REMOTE DEVICE CHECK
Battery Remaining Longevity: 114 mo
Battery Remaining Percentage: 78 %
Brady Statistic RV Percent Paced: 0 %
Date Time Interrogation Session: 20250905004100
HighPow Impedance: 78 Ohm
Implantable Lead Connection Status: 753985
Implantable Lead Implant Date: 20180523
Implantable Lead Location: 753860
Implantable Lead Model: 293
Implantable Lead Serial Number: 430811
Implantable Pulse Generator Implant Date: 20180523
Lead Channel Impedance Value: 614 Ohm
Lead Channel Setting Pacing Amplitude: 3 V
Lead Channel Setting Pacing Pulse Width: 0.4 ms
Lead Channel Setting Sensing Sensitivity: 0.5 mV
Pulse Gen Serial Number: 225390
Zone Setting Status: 755011

## 2023-10-16 NOTE — Progress Notes (Signed)
Remote ICD Transmission.

## 2023-10-19 ENCOUNTER — Ambulatory Visit: Payer: Self-pay | Admitting: Cardiology

## 2023-10-30 ENCOUNTER — Other Ambulatory Visit: Payer: Self-pay | Admitting: Family Medicine

## 2023-10-30 DIAGNOSIS — Z1231 Encounter for screening mammogram for malignant neoplasm of breast: Secondary | ICD-10-CM

## 2023-10-30 DIAGNOSIS — Z780 Asymptomatic menopausal state: Secondary | ICD-10-CM

## 2023-12-03 ENCOUNTER — Ambulatory Visit
Admission: RE | Admit: 2023-12-03 | Discharge: 2023-12-03 | Disposition: A | Discharge status: Home / Self Care | Source: Ambulatory Visit | Attending: Family Medicine | Admitting: Family Medicine

## 2023-12-03 DIAGNOSIS — Z780 Asymptomatic menopausal state: Secondary | ICD-10-CM | POA: Insufficient documentation

## 2023-12-03 DIAGNOSIS — Z1231 Encounter for screening mammogram for malignant neoplasm of breast: Secondary | ICD-10-CM | POA: Insufficient documentation

## 2023-12-19 ENCOUNTER — Ambulatory Visit: Admitting: Cardiology

## 2023-12-29 NOTE — Progress Notes (Unsigned)
 Electrophysiology Clinic Note    Date:  12/30/2023  Patient ID:  Charlene Schmitt, Charlene Schmitt February 11, 1955, MRN [id number] PCP:  Eliverto Bette Hover, MD  Cardiologist:  Evalene Lunger, MD  Electrophysiologist:  Fonda Kitty, MD  Electrophysiology APP:  Amariana Mirando, NP     Discussed the use of AI scribe software for clinical note transcription with the patient, who gave verbal consent to proceed.   Patient Profile    Chief Complaint: ICD, PVC follow-up  History of Present Illness: Charlene Schmitt is a 68 y.o. female with PMH notable for NICM, HFrEF, ICD, PVC, HTN, tobacco use; seen today for Fonda Kitty, MD (previously Dr. Fernande) or routine electrophysiology followup.   She last saw Dr. Fernande 12/2022.  She remained on amiodarone  for polymorphic PVCs.   On follow-up today, she has no concerns related to her ICD, no chest pain, chest pressure, palpitations, dizziness, lightheadedness, or presyncope. She checks blood pressure at home once or twice weekly, with usual readings 117-120/70-80 and a peak of 130/94 after activity.      Arrhythmia/Device History [employer] ICD, imp 2018; dx NICM   (GORE lead)   AAD -  Amiodarone     ROS:  Please see the history of present illness. All other systems are reviewed and otherwise negative.    Physical Exam    VS:  BP 120/74 (BP Location: Left Arm, Patient Position: Sitting, Cuff Size: Large)   Pulse 76   Ht 5' 4.5 (1.638 m)   Wt 214 lb 6 oz (97.2 kg)   SpO2 97%   BMI 36.23 kg/m  BMI: Body mass index is 36.23 kg/m.           Wt Readings from Last 3 Encounters:  12/30/23 214 lb 6 oz (97.2 kg)  02/25/23 212 lb 6 oz (96.3 kg)  01/07/23 213 lb 4 oz (96.7 kg)     GEN- The patient is well appearing, alert and oriented x 3 today.   Lungs- Clear to ausculation bilaterally, normal work of breathing.  Heart- Regular rate and rhythm, no murmurs, rubs or gallops Extremities- No peripheral edema,  warm, dry Skin-  device pocket well-healed, no tethering    Device interrogation done today and reviewed by myself:  Battery 9.5 years Lead impedence, sensing stable RV threshold slightly elevated at 2V at 0.66ms VP < 1% Very brief, rare NSVT episodes  No changes made today   Studies Reviewed   Previous EP, cardiology notes.    EKG is ordered. Personal review of EKG from today shows:    EKG Interpretation Date/Time:  Tuesday December 30 2023 09:30:57 EST Ventricular Rate:  76 PR Interval:  192 QRS Duration:  110 QT Interval:  360 QTC Calculation: 405 R Axis:   -14  Text Interpretation: Normal sinus rhythm Moderate voltage criteria for LVH, may be normal variant ( R in aVL , Cornell product ) Confirmed by Kayte Borchard [phone]) on 12/30/2023 9:33:20 AM    TTE, 05/25/2020  1. Left ventricular ejection fraction, by estimation, is 30 to 35%. The left ventricle has moderate to severely decreased function. The left ventricle demonstrates global hypokinesis. Left ventricular diastolic parameters are consistent with Grade I diastolic dysfunction (impaired relaxation). The average left ventricular global longitudinal strain is -13.8 %. The global longitudinal strain is abnormal.   2. Right ventricular systolic function is normal. The right ventricular size is normal.   3. The mitral valve is normal in structure. No evidence of mitral valve regurgitation.  4. The aortic valve was not well visualized. Aortic valve regurgitation is not visualized.   5. The inferior vena cava is normal in size with greater than 50% respiratory variability, suggesting right atrial pressure of 3 mmHg.   Long term monitor, 05/09/2020 Patient had a min HR of 56 bpm, max HR of 99 bpm, and avg HR of 75 bpm.  Predominant underlying rhythm was Sinus Rhythm.  Isolated SVEs were rare (<1.0%), and no SVE Couplets or SVE Triplets were present.  Isolated VEs were occasional (2.8%, 9045), VE Couplets were rare (<1.0%,  815), and VE Triplets were rare (<1.0%, 26). Ventricular Bigeminy and Trigeminy were present.  TTE, 05/08/2016 LVEF - 25-30%  TTE, 03/05/2016 LVEF 20-25%   Assessment and Plan     #) NICM s/p ICD Device functioning well, see paceart for details [employer] low Low VP < 1% No sustained arrhythmias noted   #) PM PVC #) amiodarone  monitoring No PVCs on today's EKG or during device interrogation Continue 200mg  amiodarone  daily Update LFT, thyroid  labs today    #) HFrEF Appears warm and euvolemic on exam Continue 6.25mg  coreg  BID, 25mg  spiro daily - intolerant to ARB (rash), ACE (cough), Jardiance  (GI upset) She follows regularly with general cardiology team       Current medicines are reviewed at length with the patient today.   The patient does not have concerns regarding her medicines.  The following changes were made today:  none  Labs/ tests ordered today include:  Orders Placed This Encounter  Procedures   T4, free   TSH   Hepatic function panel   EKG 12-Lead     Disposition: Follow up with Dr. Kennyth or EP APP in 12 months, prefer MD to establish care Continue remote monitoring  Follow-up with general cardiology in 6 months   Signed, Derold Dorsch, NP  12/30/23  12:27 PM  Electrophysiology [facility]

## 2023-12-30 ENCOUNTER — Ambulatory Visit: Attending: Cardiology | Admitting: Cardiology

## 2023-12-30 ENCOUNTER — Encounter: Payer: Self-pay | Admitting: Cardiology

## 2023-12-30 VITALS — BP 120/74 | HR 76 | Ht 64.5 in | Wt 214.4 lb

## 2023-12-30 DIAGNOSIS — Z79899 Other long term (current) drug therapy: Secondary | ICD-10-CM

## 2023-12-30 DIAGNOSIS — I5022 Chronic systolic (congestive) heart failure: Secondary | ICD-10-CM

## 2023-12-30 DIAGNOSIS — I428 Other cardiomyopathies: Secondary | ICD-10-CM

## 2023-12-30 DIAGNOSIS — I493 Ventricular premature depolarization: Secondary | ICD-10-CM | POA: Diagnosis not present

## 2023-12-30 DIAGNOSIS — Z95810 Presence of automatic (implantable) cardiac defibrillator: Secondary | ICD-10-CM | POA: Diagnosis not present

## 2023-12-30 LAB — CUP PACEART INCLINIC DEVICE CHECK
Date Time Interrogation Session: 20251202123525
HighPow Impedance: 75 Ohm
Implantable Lead Connection Status: 753985
Implantable Lead Implant Date: 20180523
Implantable Lead Location: 753860
Implantable Lead Model: 293
Implantable Lead Serial Number: 430811
Implantable Pulse Generator Implant Date: 20180523
Lead Channel Impedance Value: 550 Ohm
Lead Channel Pacing Threshold Amplitude: 2 V
Lead Channel Pacing Threshold Pulse Width: 0.4 ms
Lead Channel Sensing Intrinsic Amplitude: 20.7 mV
Lead Channel Setting Pacing Amplitude: 3 V
Lead Channel Setting Pacing Pulse Width: 0.4 ms
Lead Channel Setting Sensing Sensitivity: 0.5 mV
Pulse Gen Serial Number: 225390
Zone Setting Status: 755011

## 2023-12-30 NOTE — Patient Instructions (Signed)
 Medication Instructions:  Your physician recommends that you continue on your current medications as directed. Please refer to the Current Medication list given to you today.  *If you need a refill on your cardiac medications before your next appointment, please call your pharmacy*  Lab Work: Your provider would like for you to have following labs drawn today Liver function, TSH, T4.     Testing/Procedures: No test ordered today   Follow-Up: At [facility], you and your health needs are our priority.  As part of our continuing mission to provide you with exceptional heart care, our providers are all part of one team.  This team includes your primary Cardiologist (physician) and Advanced Practice Providers or APPs (Physician Assistants and Nurse Practitioners) who all work together to provide you with the care you need, when you need it.  Your next appointment:   6 month(s) with Dr. Gollan, 1 yr with Dr. Kennyth or Chantal Needle, NP  Provider:   Timothy Gollan, MD

## 2023-12-31 ENCOUNTER — Ambulatory Visit: Payer: Self-pay | Admitting: Cardiology

## 2023-12-31 LAB — HEPATIC FUNCTION PANEL
ALT: 12 IU/L (ref 0–32)
AST: 15 IU/L (ref 0–40)
Albumin: 3.6 g/dL — ABNORMAL LOW (ref 3.9–4.9)
Alkaline Phosphatase: 90 IU/L (ref 49–135)
Bilirubin Total: 0.3 mg/dL (ref 0.0–1.2)
Bilirubin, Direct: 0.12 mg/dL (ref 0.00–0.40)
Total Protein: 6.4 g/dL (ref 6.0–8.5)

## 2023-12-31 LAB — T4, FREE: Free T4: 1.1 ng/dL (ref 0.82–1.77)

## 2023-12-31 LAB — TSH: TSH: 0.866 u[IU]/mL (ref 0.450–4.500)

## 2023-12-31 NOTE — Telephone Encounter (Signed)
-----   Message from Suzann Riddle sent at 12/31/2023  7:58 AM EST ----- Labs stable, no change to plan  ----- Message ----- From: Rebecka Memos Lab Results In Sent: 12/31/2023   5:36 AM EST To: Suzann Riddle, NP

## 2023-12-31 NOTE — Telephone Encounter (Signed)
 I spoke with Charlene Schmitt, letting her know that as per Suzann Riddle, NP, labs are stable and no change to the current plan. Patient verbalized understanding and all questions answered.

## 2024-01-02 ENCOUNTER — Ambulatory Visit: Payer: Medicare HMO

## 2024-01-02 DIAGNOSIS — I428 Other cardiomyopathies: Secondary | ICD-10-CM

## 2024-01-04 ENCOUNTER — Ambulatory Visit: Payer: Self-pay | Admitting: Cardiology

## 2024-01-04 LAB — CUP PACEART REMOTE DEVICE CHECK
Battery Remaining Longevity: 102 mo
Battery Remaining Percentage: 70 %
Brady Statistic RV Percent Paced: 0 %
Date Time Interrogation Session: 20251205004100
HighPow Impedance: 78 Ohm
Implantable Lead Connection Status: 753985
Implantable Lead Implant Date: 20180523
Implantable Lead Location: 753860
Implantable Lead Model: 293
Implantable Lead Serial Number: 430811
Implantable Pulse Generator Implant Date: 20180523
Lead Channel Impedance Value: 652 Ohm
Lead Channel Setting Pacing Amplitude: 3 V
Lead Channel Setting Pacing Pulse Width: 0.4 ms
Lead Channel Setting Sensing Sensitivity: 0.5 mV
Pulse Gen Serial Number: 225390
Zone Setting Status: 755011

## 2024-01-06 NOTE — Progress Notes (Signed)
 Remote ICD Transmission

## 2024-02-24 ENCOUNTER — Ambulatory Visit: Admitting: Cardiovascular Disease

## 2024-04-02 ENCOUNTER — Ambulatory Visit: Payer: Medicare HMO

## 2024-07-02 ENCOUNTER — Ambulatory Visit: Payer: Medicare HMO
